# Patient Record
Sex: Female | Born: 1940 | Race: White | Hispanic: Yes | Marital: Married | Smoking: Former smoker
Health system: Southern US, Community
[De-identification: ages and names within clinical notes are randomized; demographics above are authoritative.]

## PROBLEM LIST (undated history)

## (undated) DIAGNOSIS — T884XXA Failed or difficult intubation, initial encounter: Secondary | ICD-10-CM

## (undated) DIAGNOSIS — T8859XA Other complications of anesthesia, initial encounter: Secondary | ICD-10-CM

## (undated) DIAGNOSIS — A150 Tuberculosis of lung: Secondary | ICD-10-CM

## (undated) DIAGNOSIS — Z9189 Other specified personal risk factors, not elsewhere classified: Secondary | ICD-10-CM

## (undated) DIAGNOSIS — T7840XA Allergy, unspecified, initial encounter: Secondary | ICD-10-CM

## (undated) DIAGNOSIS — I499 Cardiac arrhythmia, unspecified: Secondary | ICD-10-CM

## (undated) DIAGNOSIS — M1990 Unspecified osteoarthritis, unspecified site: Secondary | ICD-10-CM

## (undated) DIAGNOSIS — H409 Unspecified glaucoma: Secondary | ICD-10-CM

## (undated) DIAGNOSIS — E785 Hyperlipidemia, unspecified: Secondary | ICD-10-CM

## (undated) DIAGNOSIS — M8580 Other specified disorders of bone density and structure, unspecified site: Secondary | ICD-10-CM

## (undated) DIAGNOSIS — N6019 Diffuse cystic mastopathy of unspecified breast: Secondary | ICD-10-CM

## (undated) DIAGNOSIS — R011 Cardiac murmur, unspecified: Secondary | ICD-10-CM

## (undated) DIAGNOSIS — T4145XA Adverse effect of unspecified anesthetic, initial encounter: Secondary | ICD-10-CM

## (undated) DIAGNOSIS — K219 Gastro-esophageal reflux disease without esophagitis: Secondary | ICD-10-CM

## (undated) HISTORY — DX: Unspecified osteoarthritis, unspecified site: M19.90

## (undated) HISTORY — DX: Other specified disorders of bone density and structure, unspecified site: M85.80

## (undated) HISTORY — DX: Hyperlipidemia, unspecified: E78.5

## (undated) HISTORY — PX: TUBAL LIGATION: SHX77

## (undated) HISTORY — DX: Allergy, unspecified, initial encounter: T78.40XA

## (undated) HISTORY — PX: COLONOSCOPY: SHX174

## (undated) HISTORY — PX: BREAST EXCISIONAL BIOPSY: SUR124

## (undated) HISTORY — DX: Cardiac murmur, unspecified: R01.1

## (undated) HISTORY — DX: Diffuse cystic mastopathy of unspecified breast: N60.19

## (undated) HISTORY — DX: Tuberculosis of lung: A15.0

## (undated) HISTORY — PX: TONSILLECTOMY: SUR1361

---
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
## 1963-01-10 HISTORY — PX: NASAL SEPTUM SURGERY: SHX37

## 1967-01-10 DIAGNOSIS — A150 Tuberculosis of lung: Secondary | ICD-10-CM

## 1967-01-10 HISTORY — DX: Tuberculosis of lung: A15.0

## 1968-01-10 HISTORY — PX: APPENDECTOMY: SHX54

## 1970-01-09 HISTORY — PX: DILATION AND CURETTAGE OF UTERUS: SHX78

## 1976-01-10 HISTORY — PX: BREAST BIOPSY: SHX20

## 1998-06-15 ENCOUNTER — Ambulatory Visit (HOSPITAL_COMMUNITY): Admission: RE | Admit: 1998-06-15 | Discharge: 1998-06-15 | Payer: Self-pay | Admitting: *Deleted

## 1998-06-15 ENCOUNTER — Encounter: Payer: Self-pay | Admitting: *Deleted

## 1998-12-21 ENCOUNTER — Other Ambulatory Visit: Admission: RE | Admit: 1998-12-21 | Discharge: 1998-12-21 | Payer: Self-pay | Admitting: *Deleted

## 2000-01-06 ENCOUNTER — Other Ambulatory Visit: Admission: RE | Admit: 2000-01-06 | Discharge: 2000-01-06 | Payer: Self-pay | Admitting: *Deleted

## 2000-11-14 ENCOUNTER — Encounter (HOSPITAL_BASED_OUTPATIENT_CLINIC_OR_DEPARTMENT_OTHER): Payer: Self-pay | Admitting: General Surgery

## 2000-11-14 ENCOUNTER — Ambulatory Visit (HOSPITAL_COMMUNITY): Admission: RE | Admit: 2000-11-14 | Discharge: 2000-11-14 | Payer: Self-pay | Admitting: General Surgery

## 2000-11-20 ENCOUNTER — Encounter (HOSPITAL_BASED_OUTPATIENT_CLINIC_OR_DEPARTMENT_OTHER): Payer: Self-pay | Admitting: General Surgery

## 2000-11-22 ENCOUNTER — Ambulatory Visit (HOSPITAL_COMMUNITY): Admission: RE | Admit: 2000-11-22 | Discharge: 2000-11-23 | Payer: Self-pay | Admitting: General Surgery

## 2000-11-22 ENCOUNTER — Encounter (HOSPITAL_BASED_OUTPATIENT_CLINIC_OR_DEPARTMENT_OTHER): Payer: Self-pay | Admitting: General Surgery

## 2000-11-22 ENCOUNTER — Encounter (INDEPENDENT_AMBULATORY_CARE_PROVIDER_SITE_OTHER): Payer: Self-pay | Admitting: Specialist

## 2000-12-12 ENCOUNTER — Other Ambulatory Visit: Admission: RE | Admit: 2000-12-12 | Discharge: 2000-12-12 | Payer: Self-pay | Admitting: *Deleted

## 2002-01-09 HISTORY — PX: CHOLECYSTECTOMY: SHX55

## 2002-01-21 ENCOUNTER — Other Ambulatory Visit: Admission: RE | Admit: 2002-01-21 | Discharge: 2002-01-21 | Payer: Self-pay | Admitting: *Deleted

## 2002-02-26 ENCOUNTER — Encounter: Payer: Self-pay | Admitting: *Deleted

## 2002-02-26 ENCOUNTER — Ambulatory Visit (HOSPITAL_COMMUNITY): Admission: RE | Admit: 2002-02-26 | Discharge: 2002-02-26 | Payer: Self-pay | Admitting: *Deleted

## 2003-02-16 ENCOUNTER — Other Ambulatory Visit: Admission: RE | Admit: 2003-02-16 | Discharge: 2003-02-16 | Payer: Self-pay | Admitting: *Deleted

## 2003-03-04 ENCOUNTER — Ambulatory Visit (HOSPITAL_COMMUNITY): Admission: RE | Admit: 2003-03-04 | Discharge: 2003-03-04 | Payer: Self-pay | Admitting: *Deleted

## 2003-12-15 ENCOUNTER — Ambulatory Visit: Payer: Self-pay | Admitting: Internal Medicine

## 2003-12-30 ENCOUNTER — Encounter: Admission: RE | Admit: 2003-12-30 | Discharge: 2004-03-29 | Payer: Self-pay | Admitting: Internal Medicine

## 2004-02-22 ENCOUNTER — Other Ambulatory Visit: Admission: RE | Admit: 2004-02-22 | Discharge: 2004-02-22 | Payer: Self-pay | Admitting: *Deleted

## 2004-05-13 ENCOUNTER — Ambulatory Visit: Payer: Self-pay | Admitting: Internal Medicine

## 2004-05-18 ENCOUNTER — Ambulatory Visit: Payer: Self-pay | Admitting: Internal Medicine

## 2004-06-30 ENCOUNTER — Ambulatory Visit: Payer: Self-pay | Admitting: Internal Medicine

## 2005-01-09 ENCOUNTER — Encounter (INDEPENDENT_AMBULATORY_CARE_PROVIDER_SITE_OTHER): Payer: Self-pay | Admitting: *Deleted

## 2005-01-09 LAB — CONVERTED CEMR LAB

## 2005-01-13 ENCOUNTER — Other Ambulatory Visit: Admission: RE | Admit: 2005-01-13 | Discharge: 2005-01-13 | Payer: Self-pay | Admitting: Obstetrics and Gynecology

## 2005-01-16 ENCOUNTER — Ambulatory Visit: Payer: Self-pay | Admitting: Internal Medicine

## 2005-01-17 ENCOUNTER — Ambulatory Visit: Payer: Self-pay | Admitting: Internal Medicine

## 2005-03-01 ENCOUNTER — Ambulatory Visit: Payer: Self-pay | Admitting: Internal Medicine

## 2005-05-11 ENCOUNTER — Ambulatory Visit (HOSPITAL_COMMUNITY): Admission: RE | Admit: 2005-05-11 | Discharge: 2005-05-11 | Payer: Self-pay | Admitting: Obstetrics and Gynecology

## 2005-07-03 ENCOUNTER — Ambulatory Visit: Payer: Self-pay | Admitting: Internal Medicine

## 2005-09-01 ENCOUNTER — Ambulatory Visit: Payer: Self-pay | Admitting: Internal Medicine

## 2005-11-15 ENCOUNTER — Ambulatory Visit: Payer: Self-pay | Admitting: Internal Medicine

## 2005-11-15 LAB — CONVERTED CEMR LAB
HDL: 68.8 mg/dL (ref >39.0)
LDL Cholesterol: 77 mg/dL (ref 0–99)
Triglyceride fasting, serum: 49 mg/dL (ref 0–149)
VLDL: 10 mg/dL (ref 0–40)

## 2005-11-23 ENCOUNTER — Ambulatory Visit: Payer: Self-pay | Admitting: Internal Medicine

## 2005-11-23 LAB — CONVERTED CEMR LAB: Sed Rate: 19 mm/hr (ref 0–25)

## 2005-12-08 ENCOUNTER — Ambulatory Visit: Payer: Self-pay | Admitting: Family Medicine

## 2006-04-16 DIAGNOSIS — M8580 Other specified disorders of bone density and structure, unspecified site: Secondary | ICD-10-CM | POA: Insufficient documentation

## 2006-04-16 DIAGNOSIS — K589 Irritable bowel syndrome without diarrhea: Secondary | ICD-10-CM

## 2006-04-16 DIAGNOSIS — Z8611 Personal history of tuberculosis: Secondary | ICD-10-CM

## 2006-05-02 ENCOUNTER — Ambulatory Visit: Payer: Self-pay | Admitting: Internal Medicine

## 2006-05-02 LAB — CONVERTED CEMR LAB
ALT: 21 units/L (ref 0–40)
AST: 31 units/L (ref 0–37)
Albumin: 3.8 g/dL (ref 3.5–5.2)
Alkaline Phosphatase: 40 units/L (ref 39–117)
BUN: 17 mg/dL (ref 6–23)
Basophils Absolute: 0 10*3/uL (ref 0.0–0.1)
Calcium: 9 mg/dL (ref 8.4–10.5)
Chloride: 109 meq/L (ref 96–112)
Eosinophils Absolute: 0.1 10*3/uL (ref 0.0–0.6)
Eosinophils Relative: 2.8 % (ref 0.0–5.0)
GFR calc non Af Amer: 77 mL/min
Glucose, Bld: 89 mg/dL (ref 70–99)
MCV: 91.5 fL (ref 78.0–100.0)
Platelets: 219 10*3/uL (ref 150–400)
RBC: 4.46 M/uL (ref 3.87–5.11)
TSH: 1.08 microintl units/mL (ref 0.35–5.50)
Total CHOL/HDL Ratio: 2.2
Triglycerides: 47 mg/dL (ref 0–149)
WBC: 4.4 10*3/uL — ABNORMAL LOW (ref 4.5–10.5)

## 2006-05-21 ENCOUNTER — Ambulatory Visit: Payer: Self-pay | Admitting: Internal Medicine

## 2006-06-05 ENCOUNTER — Encounter: Payer: Self-pay | Admitting: Internal Medicine

## 2006-06-05 ENCOUNTER — Ambulatory Visit: Payer: Self-pay | Admitting: Internal Medicine

## 2006-06-20 ENCOUNTER — Ambulatory Visit: Payer: Self-pay | Admitting: Internal Medicine

## 2006-07-09 ENCOUNTER — Encounter (INDEPENDENT_AMBULATORY_CARE_PROVIDER_SITE_OTHER): Payer: Self-pay | Admitting: *Deleted

## 2006-07-24 ENCOUNTER — Telehealth: Payer: Self-pay | Admitting: Internal Medicine

## 2006-11-16 ENCOUNTER — Encounter: Payer: Self-pay | Admitting: Internal Medicine

## 2006-12-10 ENCOUNTER — Encounter (INDEPENDENT_AMBULATORY_CARE_PROVIDER_SITE_OTHER): Payer: Self-pay | Admitting: *Deleted

## 2006-12-12 ENCOUNTER — Telehealth (INDEPENDENT_AMBULATORY_CARE_PROVIDER_SITE_OTHER): Payer: Self-pay | Admitting: *Deleted

## 2006-12-28 ENCOUNTER — Telehealth (INDEPENDENT_AMBULATORY_CARE_PROVIDER_SITE_OTHER): Payer: Self-pay | Admitting: *Deleted

## 2007-02-05 ENCOUNTER — Encounter: Payer: Self-pay | Admitting: Internal Medicine

## 2007-06-04 ENCOUNTER — Telehealth (INDEPENDENT_AMBULATORY_CARE_PROVIDER_SITE_OTHER): Payer: Self-pay | Admitting: *Deleted

## 2007-07-01 ENCOUNTER — Encounter: Payer: Self-pay | Admitting: Internal Medicine

## 2007-09-02 ENCOUNTER — Ambulatory Visit: Payer: Self-pay | Admitting: Internal Medicine

## 2007-09-02 DIAGNOSIS — E785 Hyperlipidemia, unspecified: Secondary | ICD-10-CM | POA: Insufficient documentation

## 2007-09-02 LAB — CONVERTED CEMR LAB
HDL goal, serum: 50 mg/dL
LDL Goal: 100 mg/dL
Nitrite: NEGATIVE
Specific Gravity, Urine: >=1.03
Urobilinogen, UA: 0.2
WBC Urine, dipstick: NEGATIVE
pH: 5

## 2007-09-03 ENCOUNTER — Encounter (INDEPENDENT_AMBULATORY_CARE_PROVIDER_SITE_OTHER): Payer: Self-pay | Admitting: *Deleted

## 2007-09-03 LAB — CONVERTED CEMR LAB
ALT: 21 units/L (ref 0–35)
Basophils Absolute: 0 10*3/uL (ref 0.0–0.1)
Bilirubin, Direct: 0.1 mg/dL (ref 0.0–0.3)
CO2: 29 meq/L (ref 19–32)
Calcium: 9.6 mg/dL (ref 8.4–10.5)
Creatinine, Ser: 0.8 mg/dL (ref 0.4–1.2)
Eosinophils Absolute: 0.1 10*3/uL (ref 0.0–0.7)
GFR calc non Af Amer: 76 mL/min
Hemoglobin: 14.4 g/dL (ref 12.0–15.0)
Lymphocytes Relative: 26.8 % (ref 12.0–46.0)
MCHC: 33.8 g/dL (ref 30.0–36.0)
MCV: 93.7 fL (ref 78.0–100.0)
Neutro Abs: 2.6 10*3/uL (ref 1.4–7.7)
Neutrophils Relative %: 65.6 % (ref 43.0–77.0)
RDW: 12 % (ref 11.5–14.6)
Sodium: 143 meq/L (ref 135–145)
TSH: 1.26 microintl units/mL (ref 0.35–5.50)
Total Bilirubin: 1.4 mg/dL — ABNORMAL HIGH (ref 0.3–1.2)

## 2007-09-04 LAB — CONVERTED CEMR LAB: Vit D, 1,25-Dihydroxy: 34 (ref 30–89)

## 2007-09-10 ENCOUNTER — Ambulatory Visit: Payer: Self-pay | Admitting: Internal Medicine

## 2007-09-11 ENCOUNTER — Encounter (INDEPENDENT_AMBULATORY_CARE_PROVIDER_SITE_OTHER): Payer: Self-pay | Admitting: *Deleted

## 2007-10-29 ENCOUNTER — Encounter: Payer: Self-pay | Admitting: Internal Medicine

## 2007-12-06 ENCOUNTER — Telehealth (INDEPENDENT_AMBULATORY_CARE_PROVIDER_SITE_OTHER): Payer: Self-pay | Admitting: *Deleted

## 2007-12-11 ENCOUNTER — Telehealth: Payer: Self-pay | Admitting: Internal Medicine

## 2007-12-12 ENCOUNTER — Telehealth (INDEPENDENT_AMBULATORY_CARE_PROVIDER_SITE_OTHER): Payer: Self-pay | Admitting: *Deleted

## 2008-02-28 ENCOUNTER — Ambulatory Visit: Payer: Self-pay | Admitting: Internal Medicine

## 2008-03-15 LAB — CONVERTED CEMR LAB
ALT: 25 units/L (ref 0–35)
AST: 32 units/L (ref 0–37)
Albumin: 4 g/dL (ref 3.5–5.2)
Cholesterol: 178 mg/dL (ref 0–200)
HDL: 87.2 mg/dL (ref >39.0)
Hgb A1c MFr Bld: 5.5 % (ref 4.6–6.0)
Potassium: 3.8 meq/L (ref 3.5–5.1)
Total Bilirubin: 1.5 mg/dL — ABNORMAL HIGH (ref 0.3–1.2)
Total Protein: 6.6 g/dL (ref 6.0–8.3)
Triglycerides: 47 mg/dL (ref 0–149)
VLDL: 9 mg/dL (ref 0–40)
Vit D, 25-Hydroxy: 32 ng/mL (ref 30–89)

## 2008-03-16 ENCOUNTER — Encounter (INDEPENDENT_AMBULATORY_CARE_PROVIDER_SITE_OTHER): Payer: Self-pay | Admitting: *Deleted

## 2008-04-01 ENCOUNTER — Encounter (INDEPENDENT_AMBULATORY_CARE_PROVIDER_SITE_OTHER): Payer: Self-pay | Admitting: *Deleted

## 2008-04-16 ENCOUNTER — Ambulatory Visit: Payer: Self-pay | Admitting: Internal Medicine

## 2008-04-16 DIAGNOSIS — J45909 Unspecified asthma, uncomplicated: Secondary | ICD-10-CM | POA: Insufficient documentation

## 2008-04-16 DIAGNOSIS — E559 Vitamin D deficiency, unspecified: Secondary | ICD-10-CM

## 2008-04-16 LAB — CONVERTED CEMR LAB: Cholesterol, target level: 200 mg/dL

## 2008-07-27 ENCOUNTER — Ambulatory Visit: Payer: Self-pay | Admitting: Internal Medicine

## 2008-08-24 ENCOUNTER — Telehealth (INDEPENDENT_AMBULATORY_CARE_PROVIDER_SITE_OTHER): Payer: Self-pay | Admitting: *Deleted

## 2008-08-26 ENCOUNTER — Telehealth: Payer: Self-pay | Admitting: Internal Medicine

## 2008-08-27 ENCOUNTER — Ambulatory Visit: Payer: Self-pay | Admitting: Internal Medicine

## 2008-08-31 ENCOUNTER — Encounter (INDEPENDENT_AMBULATORY_CARE_PROVIDER_SITE_OTHER): Payer: Self-pay | Admitting: *Deleted

## 2008-09-11 ENCOUNTER — Ambulatory Visit: Payer: Self-pay | Admitting: Internal Medicine

## 2008-09-11 DIAGNOSIS — M1990 Unspecified osteoarthritis, unspecified site: Secondary | ICD-10-CM | POA: Insufficient documentation

## 2009-02-02 ENCOUNTER — Ambulatory Visit: Payer: Self-pay | Admitting: Internal Medicine

## 2009-02-02 ENCOUNTER — Telehealth: Payer: Self-pay | Admitting: Internal Medicine

## 2009-02-08 LAB — CONVERTED CEMR LAB: CRP: 0.1 mg/dL (ref <0.6)

## 2009-02-09 ENCOUNTER — Ambulatory Visit: Payer: Self-pay | Admitting: Internal Medicine

## 2009-04-12 ENCOUNTER — Ambulatory Visit: Payer: Self-pay | Admitting: Internal Medicine

## 2009-04-17 LAB — CONVERTED CEMR LAB
Alkaline Phosphatase: 42 units/L (ref 39–117)
BUN: 16 mg/dL (ref 6–23)
Basophils Relative: 0.8 % (ref 0.0–3.0)
Bilirubin, Direct: 0.1 mg/dL (ref 0.0–0.3)
Calcium: 9.7 mg/dL (ref 8.4–10.5)
Creatinine, Ser: 1 mg/dL (ref 0.4–1.2)
Eosinophils Absolute: 0.2 10*3/uL (ref 0.0–0.7)
GFR calc non Af Amer: 58.54 mL/min (ref >60)
MCHC: 34.4 g/dL (ref 30.0–36.0)
MCV: 93.8 fL (ref 78.0–100.0)
Monocytes Absolute: 0.4 10*3/uL (ref 0.1–1.0)
Neutro Abs: 3.3 10*3/uL (ref 1.4–7.7)
Neutrophils Relative %: 62.4 % (ref 43.0–77.0)
Potassium: 4.3 meq/L (ref 3.5–5.1)
RBC: 4.36 M/uL (ref 3.87–5.11)
Total Protein: 7 g/dL (ref 6.0–8.3)

## 2009-04-19 ENCOUNTER — Ambulatory Visit: Payer: Self-pay | Admitting: Internal Medicine

## 2010-02-02 ENCOUNTER — Ambulatory Visit (HOSPITAL_BASED_OUTPATIENT_CLINIC_OR_DEPARTMENT_OTHER)
Admission: RE | Admit: 2010-02-02 | Discharge: 2010-02-02 | Payer: Self-pay | Source: Home / Self Care | Attending: Internal Medicine | Admitting: Internal Medicine

## 2010-02-02 ENCOUNTER — Ambulatory Visit
Admission: RE | Admit: 2010-02-02 | Discharge: 2010-02-02 | Payer: Self-pay | Source: Home / Self Care | Attending: Internal Medicine | Admitting: Internal Medicine

## 2010-02-02 DIAGNOSIS — J069 Acute upper respiratory infection, unspecified: Secondary | ICD-10-CM | POA: Insufficient documentation

## 2010-02-03 ENCOUNTER — Telehealth (INDEPENDENT_AMBULATORY_CARE_PROVIDER_SITE_OTHER): Payer: Self-pay | Admitting: *Deleted

## 2010-02-08 NOTE — Assessment & Plan Note (Signed)
Summary: DISCUSS LABS FROM 1/6, 4 MONTH FOLLOWUP///SPH   Vital Signs:  Patient profile:   70 year old female Weight:      115 pounds BMI:     22.17 Pulse rate:   92 / minute Resp:     12 per minute BP sitting:   128 / 90  (right arm) Cuff size:   regular  Vitals Entered By: Shonna Chock (February 09, 2009 8:58 AM) CC: Follow-up visit: Discuss Labs (Copy given) Comments REVIEWED MED LIST, PATIENT AGREED DOSE AND INSTRUCTION CORRECT    CC:  Follow-up visit: Discuss Labs (Copy given).  History of Present Illness: See BP; @ home averages 124/78. Spironolactone is for edema of hands & "pre HTN". Strong FH of HTN. Labs reviewed : vitamin D level 51 on 50,000 International Units weekly  AND 2000 International Units for 3 months.BMD & NMR results pending  Allergies: 1)  ! * Hctz 2)  ! Iodine 3)  ! * Sulfates 4)  ! * Nitrites 5)  ! L-Glutamic Acid Monosod Salt (Monosodium Glutamate) 6)  ! * Perseratives in Food 7)  ! * Bee Stings  Past History:  Past Medical History: Displaced L  radial artery Hyperlipidemia: NMR 110( 1481/1136), TG 51,HDL 69. LDL goal = < 100, ideally < 75. 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 CV:  Denies chest pain or discomfort, leg cramps with exertion, palpitations, shortness of breath with exertion, swelling of feet, and swelling of hands. MS:  Complains of muscle aches; Calf cramping POST CVE. Neuro:  Denies headaches, numbness, and tingling. Psych:  Complains of anxiety; denies depression, easily angered, easily tearful, and irritability; having to care for grandchild during divorce.  Physical Exam  General:  Thin but well-nourished,alert,appropriate  and cooperative throughout examination Extremities:  No clubbing, cyanosis, edema, or deformity noted with normal full range of motion of all joints.  Minimal OA @ DIP joints & minimal crepitus of L shoulder . Crepitus of knees , L >R Neurologic:  alert & oriented X3.   Psych:  Intelligent & motivated   Impression & Recommendations:  Problem # 1:  VITAMIN D DEFICIENCY (ICD-268.9) corrected  Problem # 2:  HYPERLIPIDEMIA (ICD-272.4)  Her updated medication list for this problem includes:    Lipitor 40 Mg Tabs (Atorvastatin calcium) .Marland Kitchen... 1/2 tab mon,wed, fri  Problem # 3:  OSTEOPENIA (ICD-733.90) T score - 1.8 @ R femoral neck Her updated medication list for this problem includes:    Evista 60 Mg Tabs (Raloxifene hcl) .Marland Kitchen... 1 by mouth once daily  Complete Medication List: 1)  Lipitor 40 Mg Tabs (Atorvastatin calcium) .... 1/2 tab mon,wed, fri 2)  Multivitamins Tabs (Multiple vitamin) .... Take 1 tablet by mouth daily 3)  Calcium 1500/ Magnesium 500mg   .... 1 by mouth once daily 4)  Spironolactone 25 Mg Tabs (Spironolactone) .Marland Kitchen.. 1 once daily 5)  Omega 3  .... As directed 6)  Vitamin E 200 Unit Caps (Vitamin e) .... Take 1 capsule by mouth daily 7)  Vitamin C-rose Hips 1000 Mg Tabs (Ascorbic acid) .Marland Kitchen.. 1 by mouth two times a day 8)  Vitamin B Complex  .... As directed 9)  Coq10 100 Mg Caps (  Coenzyme q10) .Marland Kitchen.. 1 by mouth once daily 10)  Lumigan 0.03 % Soln (Bimatoprost) .Marland Kitchen.. 1 drop in left eye qhs 11)  Vitamin D 2000 Unit Tabs (Cholecalciferol) .Marland Kitchen.. 1 by mouth once daily 12)  Qvar 80 Mcg/act Aers (Beclomethasone dipropionate) .Marland Kitchen.. 1-2 puffs q 12 hrs as needed ; gargle & swallow after use 13)  Ventolin Hfa 108 (90 Base) Mcg/act Aers (Albuterol sulfate) .Marland Kitchen.. 1-2 puffs q 4  hrs as needed 14)  Vitamin D3 50,000 Unit Caps (cholecalciferol)  .... One pill weekly 15)  Evista 60 Mg Tabs (Raloxifene hcl) .Marland Kitchen.. 1 by mouth once daily 16)  Resveratrol 100 Mg Caps (Resveratrol) .Marland Kitchen.. 1 by mouth  once daily  Patient Instructions: 1)   Fasting labs 1 week pre appt  in 05/06/09(Codes V70.0, 272.4, 995.20).BMP prior to visit, ICD-9: 2)  Hepatic Panel prior to visit, ICD-9: 3)   NMR LipoprofileLipid Panel prior to visit, ICD-9: 4)  TSH prior to visit, ICD-9: 5)  CBC w/ Diff prior to visit, ICD-9:Ca++ 600 mg two times a day , vitamin 2000 International Units once daily & walking 3X/week for > 30 min.

## 2010-02-08 NOTE — Assessment & Plan Note (Signed)
Summary: cpx- jr   Vital Signs:  Patient profile:   70 year old female Height:      60.25 inches Weight:      116 pounds BMI:     22.55 Temp:     98.7 degrees F oral Pulse rate:   67 / minute Resp:     14 per minute BP sitting:   102 / 70  (left arm) Cuff size:   regular  Vitals Entered By: Shonna Chock (April 19, 2009 11:10 AM)  Comments REVIEWED MED LIST, PATIENT AGREED DOSE AND INSTRUCTION CORRECT    History of Present Illness: [name] [name] is here for a  [employer] physical; she has some arthritic symptoms which impact her walking. Preventive health care measures reviewed & up to date except no flu shot due to PMH of reactions with "flu symptoms".  Allergies: 1)  ! * Hctz 2)  ! Iodine 3)  ! * Sulfates 4)  ! * Nitrites 5)  ! L-Glutamic Acid Monosod Salt (Monosodium Glutamate) 6)  ! * Perseratives in Food 7)  ! * Bee Stings  Past History:  Past Medical History: Displaced L  radial artery Hyperlipidemia: NMR 04/12/2009: LDL 91(1114/91),HDL 103,TG 37. LDL goal = < 110, ideally < 80. 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 Breast biopsy  x 2 (bilateal fibrocystic breat disease) Cholecystectomy Tubal ligation (laparoscopic) Tonsillectomy  Family History: Father: Colon cancer @ 28, Parkinson's, prostate cancer  @ 73 (deceased) Mother: HTN, LBBB, cholecystectomy,osteopenia,  ?angina, ERD, MI  @ 44,PVD(carotids),d @ 39 Siblings:  sister: cholecyctectomy, thyroid disease, HTN ; sister : thyroid disease,fungal lung disease (?), osteopenia,lipids ,HTN;  brother: arthritis, gout, lipids,  HTN ; Paternal aunt:  DM MGF:  AAA, HTN, MI @ 56; P uncle MI  in 75s PGM:  DM, esophageal cancer; MGM CAD,HTN,osteopenia  Social History: limited calories & high fiber diet Occupation: Retired Scientist, clinical (histocompatibility and immunogenetics)  Exercise: walk 2.5 miles a day/5days a week and Wi- fit the other 2 days  Former Smoker: quit 1984 Alcohol use-yes: socially  Review of Systems  The patient denies anorexia, fever, weight loss, weight gain, vision loss, decreased hearing, hoarseness, chest pain, syncope, dyspnea on exertion, peripheral edema, headaches, abdominal pain, melena, hematochezia, severe indigestion/heartburn, hematuria, incontinence, suspicious skin lesions, depression, unusual weight change, abnormal bleeding, and enlarged lymph nodes.   Resp:  Denies chest pain with inspiration, coughing up blood, shortness of breath, sputum productive, and wheezing. MS:  Complains of joint pain; denies joint redness, joint swelling, mid back pain, and thoracic pain; L hip, LBP, toes hurt; no Rx. Neuro:  Denies brief paralysis, numbness, tingling, and weakness.  Physical Exam  General:  well-nourished; alert,appropriate and cooperative throughout examination Head:  Normocephalic and atraumatic without obvious abnormalities.  Eyes:  No corneal or conjunctival inflammation noted.Perrla. Funduscopic exam benign, without hemorrhages, exudates or papilledema.  Ears:  External ear exam shows no significant lesions or deformities.  Otoscopic examination reveals clear canals, tympanic membranes are intact bilaterally without bulging, retraction, inflammation or discharge. Hearing is grossly normal bilaterally. Nose:  External nasal examination shows no deformity or inflammation. Nasal mucosa  are pink and moist without lesions or exudates. Mouth:  Oral mucosa and oropharynx without lesions or exudates.  Teeth in good repair. Neck:  No deformities, masses, or tenderness noted. Lungs:  Normal respiratory effort, chest expands symmetrically.  Lungs are clear to auscultation, no crackles or wheezes. Heart:  normal rate, regular rhythm, no gallop, no rub, no JVD, no HJR, and grade 1/2-1  /6 systolic murmur LSB when supine.   Abdomen:  Bowel sounds positive,abdomen soft and non-tender without masses, organomegaly or hernias noted. Aortic bruit w/o AAA Genitalia:  Dr Edward Jolly Msk:  No deformity or scoliosis noted of thoracic or lumbar spine.   Pulses:  R and L carotid,radial,dorsalis pedis and posterior tibial pulses are full and equal bilaterally. L radial artery @ medial wrist Extremities:  No clubbing, cyanosis, edema, or deformity noted with normal full range of motion of all joints.  ganglion L wrist  Neurologic:  alert & oriented X3, strength normal in all extremities, heel/ toe gait normal, and DTRs symmetrical and normal.   Skin:  Intact without suspicious lesions or rashes Cervical Nodes:  No lymphadenopathy noted Axillary Nodes:  No palpable lymphadenopathy Psych:  Oriented X3, memory intact for recent and remote, and normally interactive.     Impression & Recommendations:  Problem # 1:  ROUTINE GENERAL MEDICAL EXAM@HEALTH  CARE FACL (ICD-V70.0)  Orders: EKG w/ Interpretation (93000)  Problem # 2:  HYPERLIPIDEMIA (ICD-272.4)  LDL @ goal  Her updated medication list for this problem includes:    Lipitor 40 Mg Tabs (Atorvastatin calcium) .Marland Kitchen... 1/2 tab mon,wed, fri  Orders: EKG w/ Interpretation (93000)  Problem # 3:  OSTEOPENIA (ICD-733.90)  Her updated medication list for this problem includes:    Evista 60 Mg Tabs (Raloxifene hcl) .Marland Kitchen... 1 by mouth once daily  Problem # 4:  VITAMIN D DEFICIENCY (ICD-268.9) corrected; now on 2000 International Units once daily   Complete Medication List: 1)  Lipitor 40 Mg Tabs (Atorvastatin calcium) .... 1/2 tab mon,wed, fri 2)  Multivitamins Tabs (Multiple vitamin) .... Take 1 tablet by mouth daily 3)  Calcium 1500/ Magnesium 500mg   .... 1 by mouth once daily 4)  Spironolactone  25 Mg Tabs (Spironolactone) .Marland Kitchen.. 1 once daily 5)  Omega 3  .... As directed 6)  Vitamin E 200 Units-tocopherol/tocotrienols  .... Take 1 capsule by mouth daily 7)  Vitamin C-rose Hips 1000 Mg Tabs (Ascorbic acid) .Marland Kitchen.. 1 by mouth two times a day 8)  Vitamin B Complex  .... As directed 9)  Coq10 100 Mg Caps (Coenzyme q10) .Marland Kitchen.. 1 by mouth once daily 10)  Lumigan 0.03 % Soln (Bimatoprost) .Marland Kitchen.. 1 drop in both eyes 11)  Vitamin D 2000 Unit Tabs (Cholecalciferol) .Marland Kitchen.. 1 by mouth once daily 12)  Qvar 80 Mcg/act Aers (Beclomethasone dipropionate) .Marland Kitchen.. 1-2 puffs q 12 hrs as needed ; gargle & swallow after use 13)  Ventolin Hfa 108 (90 Base) Mcg/act Aers (Albuterol sulfate) .Marland Kitchen.. 1-2 puffs q 4  hrs as needed 14)  Evista 60 Mg Tabs (Raloxifene hcl) .Marland Kitchen.. 1 by mouth once daily 15)  Resveratrol 100 Mg Caps (Resveratrol) .Marland Kitchen.. 1 by mouth once daily 16)  Vit K3  .Marland Kitchen.. 1 by mouth once daily 17)  Garlic 500 Mg Tabs (Garlic) .Marland Kitchen.. 1 by mouth once daily  Patient Instructions: 1)  Please schedule a follow-up appointment in 1 year or  as needed . BMD every 2 years. Prescriptions: SPIRONOLACTONE 25 MG  TABS (SPIRONOLACTONE) 1 once daily  #90 x 3  Entered and Authorized by:   Marga Melnick MD   Signed by:   Marga Melnick MD on 04/19/2009   Method used:   Print then Give to Patient   RxID:   [id number] LIPITOR 40 MG  TABS (ATORVASTATIN CALCIUM) 1/2 tab mon,wed, fri  #90 x 0   Entered and Authorized by:   Marga Melnick MD   Signed by:   Marga Melnick MD on 04/19/2009   Method used:   Print then Give to Patient   RxID:   [id number]

## 2010-02-08 NOTE — Progress Notes (Signed)
Summary: PHONE-LABS  Phone Note Call from Patient Call back at [facility] Phone [phone]   Caller: Patient Summary of Call: PATIENT IS REQUESTING A C REACTIVE PROTEIN TEST DONE THIS MORNING WITH OTHER BLOODWORK. PATIENT HAS APPT THIS MORNING Initial call taken by: Barb Merino,  February 02, 2009 8:22 AM  Follow-up for Phone Call        dr hopper pls advise................Marland KitchenFelecia Deloach CMA  February 02, 2009 8:46 AM   Additional Follow-up for Phone Call Additional follow up Details #1::        OK; Code 272.4 Additional Follow-up by: Marga Melnick MD,  February 02, 2009 8:56 AM

## 2010-02-09 ENCOUNTER — Ambulatory Visit (INDEPENDENT_AMBULATORY_CARE_PROVIDER_SITE_OTHER): Payer: Medicare Other | Admitting: Internal Medicine

## 2010-02-09 ENCOUNTER — Encounter: Payer: Self-pay | Admitting: Internal Medicine

## 2010-02-09 DIAGNOSIS — R05 Cough: Secondary | ICD-10-CM

## 2010-02-09 DIAGNOSIS — J209 Acute bronchitis, unspecified: Secondary | ICD-10-CM

## 2010-02-10 NOTE — Assessment & Plan Note (Signed)
Summary: POSSIBLE URI/KB   Vital Signs:  Patient profile:   70 year old female Weight:      114 pounds BMI:     22.16 O2 Sat:      99 % on Room air Temp:     99.4 degrees F oral Pulse rate:   72 / minute Resp:     13 per minute BP sitting:   120 / 76  (left arm) Cuff size:   regular  Vitals Entered By: Shonna Chock CMA (February 02, 2010 2:31 PM)  O2 Flow:  Room air CC: URI: fever/cough x 7 days , URI symptoms   CC:  URI: fever/cough x 7 days  and URI symptoms.  History of Present Illness:    Onset as ST 01/26/2010; she now reports scant  purulent nasal discharge, residual  sore throat, and dry cough, but denies nasal congestion and earache ( but pressure on airplane , R >L).  Associated symptoms include fever of 100.5-103 degrees and expiratory  wheezing.  The patient denies dyspnea.  The patient denies persistent  headache.  Risk factors for Strep sinusitis include tender adenopathy.  The patient denies the following risk factors for Strep sinusitis: bilateral facial pain and tooth pain.  Rx: Actifed, Ventolin, QVAR, ASA.  Current Medications (verified): 1)  Lipitor 40 Mg  Tabs (Atorvastatin Calcium) .... 1/2 Tab Mon,wed, Fri 2)  Multivitamins   Tabs (Multiple Vitamin) .... Take 1 Tablet By Mouth Daily 3)  Calcium 1500/ Magnesium 500mg  .... 1 By Mouth Once Daily 4)  Spironolactone 25 Mg  Tabs (Spironolactone) .Marland Kitchen.. 1 Once Daily 5)  Omega 3 .... As Directed 6)  Coq10 100 Mg Caps (Coenzyme Q10) .Marland Kitchen.. 1 By Mouth Once Daily 7)  Lumigan 0.03 % Soln (Bimatoprost) .Marland Kitchen.. 1 Drop in Both Eyes 8)  Vitamin D 2000 Unit Tabs (Cholecalciferol) .Marland Kitchen.. 1 By Mouth Once Daily 9)  Qvar 80 Mcg/act Aers (Beclomethasone Dipropionate) .Marland Kitchen.. 1-2 Puffs Q 12 Hrs As Needed ; Gargle & Swallow After Use 10)  Ventolin Hfa 108 (90 Base) Mcg/act Aers (Albuterol Sulfate) .Marland Kitchen.. 1-2 Puffs Q 4  Hrs As Needed 11)  Evista 60 Mg Tabs (Raloxifene Hcl) .Marland Kitchen.. 1 By Mouth Once Daily 12)  Resveratrol 100 Mg Caps (Resveratrol) .Marland Kitchen..  1 By Mouth Once Daily 13)  Vit K3 .Marland Kitchen.. 1 By Mouth Once Daily  Allergies: 1)  ! * Hctz 2)  ! Iodine 3)  ! * Sulfates 4)  ! * Nitrites 5)  ! L-Glutamic Acid Monosod Salt (Monosodium Glutamate) 6)  ! * Perseratives in Food 7)  ! * Bee Stings  Physical Exam  General:  in no acute distress; alert,appropriate and cooperative throughout examination Ears:  External ear exam shows no significant lesions or deformities.  Otoscopic examination reveals  wax collections Nose:  External nasal examination shows no deformity or inflammation. Nasal mucosa are pink and moist without lesions or exudates. Mouth:  Oral mucosa and oropharynx without lesions or exudates.  Teeth in good repair. Slightly hoarse Lungs:  Normal respiratory effort, chest expands symmetrically. Lungs :coarse crackles LLL; no  wheezes. Paroxysmal brassy cough Heart:  Normal rate and regular rhythm. S1 and S2 normal without gallop, murmur, click, rub or other extra sounds. Extremities:  No clubbing, cyanosis, edema.   Skin:  Intact without suspicious lesions or rashes Cervical Nodes:  No lymphadenopathy noted Axillary Nodes:  No palpable lymphadenopathy Psych:  memory intact for recent and remote, normally interactive, and good eye contact.  Impression & Recommendations:  Problem # 1:  BRONCHITIS-ACUTE (ICD-466.0)  R/O LLL  PNA Her updated medication list for this problem includes:    Qvar 80 Mcg/act Aers (Beclomethasone dipropionate) .Marland Kitchen... 1-2 puffs q 12 hrs as needed ; gargle & swallow after use    Ventolin Hfa 108 (90 Base) Mcg/act Aers (Albuterol sulfate) .Marland Kitchen... 1-2 puffs q 4  hrs as needed    Clarithromycin 500 Mg Xr24h-tab (Clarithromycin) .Marland Kitchen... 2 once daily with a meal ; hold lipitor while on this    Hydromet 5-1.5 Mg/61ml Syrp (Hydrocodone-homatropine) .Marland Kitchen... 1 tsp every 6 hrs as needed  Orders: T-2 View CXR (71020TC) Prescription Created Electronically [phone])  Problem # 2:  URI (ICD-465.9)  Her updated  medication list for this problem includes:    Hydromet 5-1.5 Mg/51ml Syrp (Hydrocodone-homatropine) .Marland Kitchen... 1 tsp every 6 hrs as needed  Orders: Prescription Created Electronically [phone])  Complete Medication List: 1)  Lipitor 40 Mg Tabs (Atorvastatin calcium) .... 1/2 tab mon,wed, fri 2)  Multivitamins Tabs (Multiple vitamin) .... Take 1 tablet by mouth daily 3)  Calcium 1500/ Magnesium 500mg   .... 1 by mouth once daily 4)  Spironolactone 25 Mg Tabs (Spironolactone) .Marland Kitchen.. 1 once daily 5)  Omega 3  .... As directed 6)  Coq10 100 Mg Caps (Coenzyme q10) .Marland Kitchen.. 1 by mouth once daily 7)  Lumigan 0.03 % Soln (Bimatoprost) .Marland Kitchen.. 1 drop in both eyes 8)  Vitamin D 2000 Unit Tabs (Cholecalciferol) .Marland Kitchen.. 1 by mouth once daily 9)  Qvar 80 Mcg/act Aers (Beclomethasone dipropionate) .Marland Kitchen.. 1-2 puffs q 12 hrs as needed ; gargle & swallow after use 10)  Ventolin Hfa 108 (90 Base) Mcg/act Aers (Albuterol sulfate) .Marland Kitchen.. 1-2 puffs q 4  hrs as needed 11)  Evista 60 Mg Tabs (Raloxifene hcl) .Marland Kitchen.. 1 by mouth once daily 12)  Resveratrol 100 Mg Caps (Resveratrol) .Marland Kitchen.. 1 by mouth once daily 13)  Vit K3  .Marland Kitchen.. 1 by mouth once daily 14)  Clarithromycin 500 Mg Xr24h-tab (Clarithromycin) .... 2 once daily with a meal ; hold lipitor while on this 15)  Prednisone 20 Mg Tabs (Prednisone) .Marland Kitchen.. 1 two times a day with meals 16)  Hydromet 5-1.5 Mg/67ml Syrp (Hydrocodone-homatropine) .Marland Kitchen.. 1 tsp every 6 hrs as needed  Patient Instructions: 1)  Drink as much fluid as you can tolerate for the next few days. Prescriptions: HYDROMET 5-1.5 MG/5ML SYRP (HYDROCODONE-HOMATROPINE) 1 tsp every 6 hrs as needed  #120 cc x 0   Entered and Authorized by:   Marga Melnick MD   Signed by:   Marga Melnick MD on 02/02/2010   Method used:   Printed then faxed to ...       [facility] [phone]* (retail)       [address]       [place], [place]  [address]       Ph: [phone]       Fax: [phone]   RxID:    [id number] PREDNISONE 20 MG TABS (PREDNISONE) 1 two times a day with meals  #14 x 0   Entered and Authorized by:   Marga Melnick MD   Signed by:   Marga Melnick MD on 02/02/2010   Method used:   Electronically to        [facility] [phone]* (retail)       [address]       [place],  [place]  [address]       Ph: [phone]       Fax: [phone]   RxID:   [id number] CLARITHROMYCIN 500 MG XR24H-TAB (CLARITHROMYCIN) 2 once daily with a meal ; hold Lipitor while on this  #20 x 0   Entered and Authorized by:   Marga Melnick MD   Signed by:   Marga Melnick MD on 02/02/2010   Method used:   [facility] to        [facility] [phone]* (retail)       [address]       [place], [place]  [address]       Ph: [phone]       Fax: [phone]   RxID:   [id number]    Orders Added: 1)  Est. Patient Level III [66063] 2)  T-2 View CXR [71020TC] 3)  Prescription Created Electronically [phone]

## 2010-02-10 NOTE — Progress Notes (Signed)
Summary: Chest Xray Results  Phone Note Outgoing Call Call back at Home Phone [phone]   Call placed by: Shonna Chock CMA,  February 03, 2010 9:00 AM Call placed to: Patient Summary of Call: Left message on machine for patient to return call when avaliable, Reason for call:   No radiographic pneumonia , but the persistent rales in the LLL are worrisome for subclinical disease. Sometimes PNA wil not be apparent if there is any componenet of dehygration.Please finish entire course of meds. Levester Fresh CMA  February 03, 2010 9:01 AM   Follow-up for Phone Call        Spoke with patient, patient ok'd results and aware copy of report to be mailed  Follow-up by: Shonna Chock CMA,  February 03, 2010 9:23 AM

## 2010-02-16 NOTE — Miscellaneous (Signed)
Summary: Orders Update  Clinical Lists Changes  Orders: Added new Service order of Prescription Created Electronically ([id number]) - Signed 

## 2010-02-16 NOTE — Assessment & Plan Note (Signed)
Summary: DISCUSS NEW ABS FOR URI/KB   Vital Signs:  Patient profile:   70 year old female Weight:      114.6 pounds BMI:     22.28 Temp:     98.3 degrees F oral Pulse rate:   60 / minute Resp:     13 per minute BP sitting:   108 / 70  (left arm) Cuff size:   regular  Vitals Entered By: Shonna Chock CMA (February 09, 2010 11:38 AM) CC: URI: ongoing post nasal drip and chronic cough. Patient contacted ENT and first avaliable 03/2010, URI symptoms   CC:  URI: ongoing post nasal drip and chronic cough. Patient contacted ENT and first avaliable 03/2010 and URI symptoms.  History of Present Illness:    Major residual  issues are brassy cough, PNDrainage & head congestion; she has 2 days left of Clarithromycin. The patient now  reports dry cough, but denies purulent nasal discharge, sore throat (except with the cough syrup), and earache.  Associated symptoms include  expiratory wheezing.  The patient denies fever and dyspnea.  The patient denies  frontal headache,bilateral facial pain, tooth pain, and tender adenopathy.  Oral steroids & QVAR did not help.   Allergies: 1)  ! * Hctz 2)  ! Iodine 3)  ! * Sulfates 4)  ! * Nitrites 5)  ! L-Glutamic Acid Monosod Salt (Monosodium Glutamate) 6)  ! * Perseratives in Food 7)  ! * Bee Stings  Physical Exam  General:  well-nourished,in no acute distress; alert,appropriate and cooperative throughout examination Ears:  External ear exam shows no significant lesions or deformities.  Otoscopic examination reveals clear canals, tympanic membranes are intact bilaterally without bulging, retraction, inflammation or discharge. Some wax bilaterally Nose:  External nasal examination shows no deformity or inflammation. Nasal mucosa are pink and moist without lesions or exudates. Mouth:  Oral mucosa and oropharynx without lesions or exudates.  Teeth in good repair. Lungs:  Normal respiratory effort, chest expands symmetrically. Lungs are clear to  auscultation, no crackles or wheezes but brassy , paroxysmal cough Cervical Nodes:  minor L  posterior LA Axillary Nodes:  No palpable lymphadenopathy   Impression & Recommendations:  Problem # 1:  BRONCHITIS-ACUTE (ICD-466.0) resolving Her updated medication list for this problem includes:    Qvar 80 Mcg/act Aers (Beclomethasone dipropionate) .Marland Kitchen... 1-2 puffs q 12 hrs as needed ; gargle & swallow after use (Hold while on Dulera)    Ventolin Hfa 108 (90 Base) Mcg/act Aers (Albuterol sulfate) .Marland Kitchen... 1-2 puffs q 4  hrs as needed    Clarithromycin 500 Mg Xr24h-tab (Clarithromycin) .Marland Kitchen... 2 once daily with a meal ; hold lipitor while on this    Hydromet 5-1.5 Mg/63ml Syrp (Hydrocodone-homatropine) .Marland Kitchen... 1 tsp every 6 hrs as needed    Dulera 200-5 Mcg/act Aero (Mometasone furo-formoterol fum) .Marland Kitchen... 1-2 puffs every 12 hrs ; gargle & spit  after use    Singulair 10 Mg Tabs (Montelukast sodium) .Marland Kitchen... 1 once daily as needed  Problem # 2:  COUGH (ICD-786.2) probable RAD from # 1  Complete Medication List: 1)  Lipitor 40 Mg Tabs (Atorvastatin calcium) .... 1/2 tab mon,wed, fri 2)  Multivitamins Tabs (Multiple vitamin) .... Take 1 tablet by mouth daily 3)  Calcium 1500/ Magnesium 500mg   .... 1 by mouth once daily 4)  Spironolactone 25 Mg Tabs (Spironolactone) .Marland Kitchen.. 1 once daily 5)  Omega 3  .... As directed 6)  Coq10 100 Mg Caps (Coenzyme q10) .Marland Kitchen.. 1 by mouth  once daily 7)  Lumigan 0.03 % Soln (Bimatoprost) .Marland Kitchen.. 1 drop in both eyes 8)  Vitamin D 2000 Unit Tabs (Cholecalciferol) .Marland Kitchen.. 1 by mouth once daily 9)  Qvar 80 Mcg/act Aers (Beclomethasone dipropionate) .Marland Kitchen.. 1-2 puffs q 12 hrs as needed ; gargle & swallow after use 10)  Ventolin Hfa 108 (90 Base) Mcg/act Aers (Albuterol sulfate) .Marland Kitchen.. 1-2 puffs q 4  hrs as needed 11)  Evista 60 Mg Tabs (Raloxifene hcl) .Marland Kitchen.. 1 by mouth once daily 12)  Resveratrol 100 Mg Caps (Resveratrol) .Marland Kitchen.. 1 by mouth once daily 13)  Vit K3  .Marland Kitchen.. 1 by mouth once daily 14)   Clarithromycin 500 Mg Xr24h-tab (Clarithromycin) .... 2 once daily with a meal ; hold lipitor while on this 15)  Hydromet 5-1.5 Mg/23ml Syrp (Hydrocodone-homatropine) .Marland Kitchen.. 1 tsp every 6 hrs as needed 16)  Fluticasone Propionate 50 Mcg/act Susp (Fluticasone propionate) .Marland Kitchen.. 1 spray two times a day as needed 17)  Dulera 200-5 Mcg/act Aero (Mometasone furo-formoterol fum) .Marland Kitchen.. 1-2 puffs every 12 hrs ; gargle & spit  after use 18)  Singulair 10 Mg Tabs (Montelukast sodium) .Marland Kitchen.. 1 once daily as needed  Patient Instructions: 1)  Neti pot once daily- two times a day as needed  followed by nasal spray.Drink as much NON dairy  fluid as you can tolerate for the next few days. Chew sugarless gum & do Valsalva in flight as needed  Prescriptions: SINGULAIR 10 MG TABS (MONTELUKAST SODIUM) 1 once daily as needed  #30 x 5   Entered and Authorized by:   Marga Melnick MD   Signed by:   Marga Melnick MD on 02/09/2010   Method used:   Print then Give to Patient   RxID:   [id number] DULERA 200-5 MCG/ACT AERO (MOMETASONE FURO-FORMOTEROL FUM) 1-2 puffs every 12 hrs ; gargle & spit  after use  #1 x 5   Entered and Authorized by:   Marga Melnick MD   Signed by:   Marga Melnick MD on 02/09/2010   Method used:   Print then Give to Patient   RxID:   [id number] FLUTICASONE PROPIONATE 50 MCG/ACT SUSP (FLUTICASONE PROPIONATE) 1 spray two times a day as needed  #1 x 5   Entered and Authorized by:   Marga Melnick MD   Signed by:   Marga Melnick MD on 02/09/2010   Method used:   Electronically to        [facility] [phone]* (retail)       [address]       [place], [place]  [address]       Ph: [phone]       Fax: [phone]   RxID:   [id number]    Orders Added: 1)  Est. Patient Level III [01093]

## 2010-03-18 ENCOUNTER — Telehealth (INDEPENDENT_AMBULATORY_CARE_PROVIDER_SITE_OTHER): Payer: Self-pay | Admitting: *Deleted

## 2010-03-22 NOTE — Progress Notes (Signed)
Summary: questions about labs  Phone Note Call from Patient Call back at Home Phone [phone]   Caller: Patient Summary of Call: patient is now scheduled for labs on 4/11 and cpx on 4/18---she says she and Dr Alwyn Ren discussed her lab work at earlier  visit  (she is in the healthcare business too)  and she called and made appt for labs with Lowella Bandy this morning and it was set up for one week before her physical.   She called back to add more labs to her list and I pointed out to her that Dr Alwyn Ren wants his Medicare patients to have labs on the day of the physical or after---(she has Medicare)  1)  she wanted me to check to see if Dr Alwyn Ren said it is OK for her to come in one week early for her labs 2)  please review Huge list of lab work for the 4/11 lab appt---this was her list that she gave to [facility] , then she called back and added more and I cannot find a list of future labs with diag codes  needs call back at [phone] Initial call taken by: Jerolyn Shin,  March 18, 2010 9:49 AM  Follow-up for Phone Call        left message on machine .......Marland KitchenDoristine Devoid CMA  March 18, 2010 10:51 AM   spoke w/ patient explain information and says that she will just wait to have labs when she comes in for appt.......Marland KitchenDoristine Devoid CMA  March 18, 2010 11:43 AM

## 2010-04-20 ENCOUNTER — Other Ambulatory Visit: Payer: PRIVATE HEALTH INSURANCE

## 2010-04-27 ENCOUNTER — Ambulatory Visit (INDEPENDENT_AMBULATORY_CARE_PROVIDER_SITE_OTHER): Payer: Medicare Other | Admitting: Internal Medicine

## 2010-04-27 ENCOUNTER — Encounter: Payer: Self-pay | Admitting: Internal Medicine

## 2010-04-27 DIAGNOSIS — E559 Vitamin D deficiency, unspecified: Secondary | ICD-10-CM

## 2010-04-27 DIAGNOSIS — Z0000 Encounter for general adult medical examination without abnormal findings: Secondary | ICD-10-CM

## 2010-04-27 DIAGNOSIS — M949 Disorder of cartilage, unspecified: Secondary | ICD-10-CM

## 2010-04-27 DIAGNOSIS — Z8611 Personal history of tuberculosis: Secondary | ICD-10-CM

## 2010-04-27 DIAGNOSIS — E785 Hyperlipidemia, unspecified: Secondary | ICD-10-CM

## 2010-04-27 NOTE — Progress Notes (Signed)
Subjective:    Patient ID: [name] [name], female    DOB: 1940/10/24, 70 y.o.   MRN: [id number]  HPI Medicare Wellness Visit:  The following psychosocial & medical history were reviewed as required by Medicare.   Social history: caffeine:1  diet coke daily , alcohol:< 1 /day ,  tobacco use: quit 1980  & exercise: walking & yoga 6X/ week for > 60 min.   Home & personal  Safety: no issues , activities of daily living: no limitations , seatbelt use: yes , and smoke alarm employment: yes .  Power of AttorneyLliving Will status  In place  Hearing and vision evaluation  Whisper heard @ 6 ft. Orientation :oriented X3 , memory & recall:  Normal , spelling  excellent ,and mood & affect   normal  Travel history  : 2007 [place] , immunization status : Up to date , transfusion history :no, and preventive health surveillance :up to date, Dental care : every 3 months . Chart reviewed &  Updated. Active issues reviewed & addressed.   Her major active issue is arthralgia involving the right lower back and the left hip. She is taking a supplement with good response.        Review of Systems Patient reports no vision/ hearing  changes, adenopathy,fever, weight change,  persistant / recurrent hoarseness , swallowing issues, chest pain,palpitations,edema,persistant /recurrent cough, hemoptysis, dyspnea( rest/ exertional/paroxysmal nocturnal), gastrointestinal bleeding(melena, rectal bleeding), abdominal pain, significant heartburn, bowel changes,GU symptoms(dysuria, hematuria,pyuria, incontinence) ), Gyn symptoms(abnormal  bleeding , pain),  syncope, focal weakness, memory loss,numbness & tingling, skin/hair /nail changes,abnormal bruising or bleeding, anxiety,or depression. She stays cold; no Raynaud's     Objective:   Physical Exam Gen.: Thin but healthy and well-nourished in appearance. Alert, appropriate and cooperative throughout exam. Head: Normocephalic without obvious abnormalities;  Hair fine.Eyes: No  corneal or conjunctival inflammation noted. Pupils equal round reactive to light and accommodation. Fundal exam is benign without hemorrhages, exudate, papilledema. Extraocular motion intact. Vision grossly normal. Ears: External  ear exam reveals no significant lesions or deformities. Canals clear .TMs normal. Hearing is grossly normal bilaterally. Nose: External nasal exam reveals no deformity or inflammation. Nasal mucosa are pink and moist. No lesions or exudates noted. Septum  normal  Mouth: Oral mucosa and oropharynx reveal no lesions or exudates. Teeth in good repair. Neck: No deformities, masses, or tenderness noted. Range of motion normal. Thyroid: no nodules or enlargement. Lungs: Normal respiratory effort; chest expands symmetrically. Lungs are clear to auscultation without rales, wheezes, or increased work of breathing. Heart: Normal rate and rhythm. Normal S1 and S2. No gallop, click, or rub. Grade 1/2 systolic  murmur. Abdomen: Bowel sounds normal; abdomen soft and nontender. No masses, organomegaly or hernias noted. Genitalia: Dr Edward Jolly                                                                                     Musculoskeletal/extremities: No deformity or scoliosis noted of  the thoracic or lumbar spine. No clubbing, cyanosis, edema, or deformity noted. Range of motion  supranormal .Tone & strength  normal.Joints normal. Nail health  good. Vascular: Carotid, radial artery, dorsalis pedis and dorsalis posterior  tibial pulses are full and equal. Aorta palpable. No bruits present. Neurologic: Alert and oriented x3. Deep tendon reflexes symmetrical and normal.         Skin: Intact without suspicious lesions or rashes. Lymph: No cervical, axillary, or inguinal lymphadenopathy present. Psych: Mood and affect are normal. Normally interactive                                                                                         Assessment & Plan:  #1 Medical  Wellness exam;  criteria met and data  entered  #2 arthralgias, left hip right lower back. This has been responsive to supplements.  #3 dyslipidemia. She expresses an interest in coming off her statin. Family history is strongly positive for premature coronary artery disease/myocardial infarction  Plan: #1 an advanced cholesterol panel ([place] heart panel) will be scheduled. This along with the prior MMR lipoprotein  will provide optimal risk assessment

## 2010-04-27 NOTE — Patient Instructions (Addendum)
Please stop the Lipitor. After 12 weeks have an advanced cholesterol panel (Boston heart panel, 1304X) performed with a Vitamin D level. This will optimally assess your long-term risk and indications ,if  any, for taking a statin. After you receive the results of that test please bring the booklet and  your prior NMR lipoprotein to a follow up appt  to allow a detailed evaluation and assessment. (Codes: 272.4, V17.3, 268.9) Preventive Health Care: Exercise  30-45  minutes a day, 3-4 days a week. Walking is especially valuable in preventing Osteoporosis. Eat a low-fat diet with lots of fruits and vegetables, up to 7-9 servings per day. Avoid obesity; your goal = waist less than 35 inches.Consume less than 30 grams of sugar per day from foods & drinks with High Fructose Corn Syrup as #2,3 or #4 on label.

## 2010-04-27 NOTE — Assessment & Plan Note (Signed)
RUL 1969; treated with isolation & 3 drugs

## 2010-04-27 NOTE — Assessment & Plan Note (Signed)
NMR Lipoprofile completed

## 2010-05-23 ENCOUNTER — Other Ambulatory Visit: Payer: Self-pay | Admitting: *Deleted

## 2010-05-23 MED ORDER — SPIRONOLACTONE 25 MG PO TABS: 25.0000 mg | ORAL_TABLET | Freq: Every day | ORAL | Status: DC

## 2010-05-24 NOTE — Letter (Signed)
May 07, 2006    Titus Dubin. Alwyn Ren, MD,FACP,FCCP  [phone] [place]  [place] [address]   RE:  [name], [name]  MRN:  [id number]  /  DOB:  04/09/1940   Dear Hop,   Thank you for sending me a progress note on [name] [name].  [name] [name]  was asking me about followup colonoscopy.  The last one was done in  November of 2000 and she has a positive family history of colon cancer  in her father.  I have reviewed the chart and I agree with you that  there has been a mistake made in her recall program.  She are to have a  colonoscopy every five years.  Since the last one was in November of  2000, she ought to have won this year.  With your permission, we will  notify [name] [name] and schedule her for a colonoscopy, and her earliest  convenience.   Thank you very much for bringing this to my attention.    Sincerely,      Hedwig Morton. Juanda Chance, MD  Electronically Signed    DMB/MedQ  DD: 05/07/2006  DT: 05/08/2006  Job #: [id number]

## 2010-05-27 NOTE — Op Note (Signed)
[facility]  Patient:    [name] [name], [name] [name] Visit Number: [id number] MRN: [id number]          Service Type: DSU Location: [facility] 28[address] 21 Attending Physician:  Sonda Primes Dictated by:   Mardene Celeste Lurene Shadow, M.D. Proc. Date: 11/22/00 Admit Date:  11/22/2000                             Operative Report  PREOPERATIVE DIAGNOSIS:  Chronic calculous cholecystitis.  POSTOPERATIVE DIAGNOSIS:  Chronic calculous cholecystitis.  PROCEDURE:  Laparoscopic cholecystectomy with intraoperative cholangiogram.  SURGEON:  Luisa Hart L. Lurene Shadow, M.D.  ASSISTANT:  Marnee Spring. Wiliam Ke, M.D.  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  She was evaluated by ultrasound and noted to have cholelithiasis and some thickening of her gallbladder wall. The common duct appeared to be of normal caliber at 3 mm.  Liver function studies were done.  Alkaline phosphatase, SGOT, SGPT, and bilirubin were all within normal limits.  The amylase and lipase levels were also within normal limits.  She is brought now to the operating room for a laparoscopic cholecystectomy after the risks and potential benefits of surgery have been fully discussed and she gives consent.  DESCRIPTION OF PROCEDURE:  Following the induction of satisfactory general anesthesia with the patient positioned supinely, the abdomen is routinely prepped and draped to be included in the sterile operative field.  Open laparoscopy is created at the umbilicus through a previous supraumbilical scar at an abdominal site, and the abdomen was entered and the Hasson cannula inserted at 14 mmHg using CO2 was inserted into the abdomen.  The camera was inserted, visual exploration of the abdomen carried out.  The gallbladder was noted to be chronically scarred.  There were  multiple adhesions from the duodenum and around the abdomen.  The liver edges were sharp, and the surface was smooth.  The anterior gastric wall and duodenal sweep appeared to be normal.  None of the small or large intestine viewed appeared to be abnormal. There were some adhesions in the right lower quadrant from a previous appendectomy site.  The pelvic organs were not visualized.  Under direct vision epigastric and lateral ports were placed.  The gallbladder was grasped and retracted cephalad and adhesions dissected free from the gallbladder wall and around the ampulla.  Dissection in the region of the ampulla isolated the cystic artery and the cystic duct.  The cystic duct was somewhat larger than usual, and this was dissected free up to the cystic duct and gallbladder junction.  The cystic artery was then also dissected free, doubly clipped, and transected.  The cystic duct was clipped proximally and opened.  A cholangiogram was carried out using a Reddick catheter, which was passed into the abdomen through a 14-gauge Angiocath.  Approximately 3 cc of Renografin was injected into the biliary system.  There was free flow of contrast into the common bile duct and down into the duodenum.  There was normal tapering of the distal common bile duct.  The upper radicles were of normal caliber and appeared without any filling defects.  The cholangiocatheter was then removed and the cystic duct then triply clipped and transected.  The gallbladder then dissected free from the liver bed using electrocautery, maintaining hemostasis throughout the course of this dissection.  At  the end of the dissection the liver bed was again thoroughly inspected.  Additional bleeding points were treated with electrocautery.  The right upper quadrant was thoroughly irrigated with normal saline.  The camera then removed through the epigastric port and the gallbladder retrieved through the umbilical port without  difficulty.  The pneumoperitoneum was then allowed to deflate completely.  Sponge, instrument, and sharp counts were then verified and the wound was then closed in layers as follows:  The umbilical wound closed in two layers with 0 Dexon and 4-0 Dexon, the epigastric and lateral wounds were then closed with 4-0 Dexon sutures.  All wounds were reinforced with Steri-Strips and sterile dressings applied.  Anesthetic reversed, patient removed from the operating room to the recovery room in stable condition, having tolerated the procedure well. Dictated by:   Mardene Celeste. Lurene Shadow, M.D. Attending Physician:  Sonda Primes DD:  11/22/00 TD:  11/22/00 Job: [id number]

## 2010-05-27 NOTE — Assessment & Plan Note (Signed)
[facility]                        [place] OFFICE NOTE   NAME:[name], [name] WILLINGHAM                      MRN:          [id number]  DATE:05/02/2006                            DOB:          1940-07-22    [name], date of birth 1940/12/31, unit number [id number],  was seen for a comprehensive physical examination on May 02, 2006.   Active problems include arthralgias, particularly in the left hip and  knee, for which she is taking 1500 mg of glucosamine and 1200 mg of  chondroitin.  She does have osteopenia; her T score was -2.2 at the  femoral neck in May of 2006.  She has been on 1200 mg of calcium, but is  concerned about recent reports suggesting increased cardiovascular risks  with calcium intake over 1000 mg a day.  She feels that she is getting  much more than a 1000 international units of vitamin D daily.   She will walk 4 miles per day and also employs weights; she has no  significant musculoskeletal symptoms or cardiopulmonary symptoms with  this.   PAST MEDICAL HISTORY:  Negative colonoscopy in  2003.  She is concerned  that she is not due for follow up until 2013 by Dr. Juanda Chance.  Her father  had colon cancer and maternal grandmother had esophageal cancer. She  will contact Dr Regino Schultze office concerning colonoscopy frequency  indications based on family history.   She has also had an appendectomy, breast biopsy which revealed  fibrocystic disease, one pregnancy and one delivery, laparoscopic tubal  and also a cholecystectomy.  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   Maternal grandfather had aortic aneurysm and hypertension.  Mother has  hypertension, left bundle branch block, questionable angina,  esophageal  reflux, and myocardial infarction.  Maternal grandmother had diabetes as  well as the esophageal cancer.  Maternal aunt had diabetes.  Her father  also had Parkinson's and prostate cancer.  Two sisters have had  cholecystectomy, as has her mother.  Her mother, sister and brother have  arthritis; brother has been diagnosed with gout.   She quit smoking in 1984.  She drinks socially.   She has no known drug allergies but Hydrochlorothiazide caused  hypokalemia.   She is on Vivelle Dot &  Prometrium 100 mg  from Dr Wyvonnia Lora;  Fosamax with vitamin D; Spironolactone 25 mg every other day;  multivitamin supplements; Lipitor 40 mg 1/2 three days a week and over-  the-counter ranitidine or Pepcid if needed.   The remainder of the review of systems was completed in toto and is  negative.   She is 5 foot 1/2 inch and weight is  stable @117  fully clothed. Pulse  is 72 and regular, respiratory rate 12 and blood pressure 110/72.  Fundal exam reveals essentially normal vasculature.  She states that she  has been diagnosed with cataracts,  but these were nor visible with the  ophthalmoscope.  Dental hygiene is exquisite.  Otolaryngologic exam is  unremarkable.  Thyroid is normal to palpation.  I cannot appreciate murmur at this time.  Chest was clear.  She does have a bruit over the aorta, but there is no aortic aneurysm.  She has no organomegaly or lymphadenopathy.  She has mild crepitus in the left knee; she has negative straight leg  raising.  The remainder of the musculoskeletal exam is unremarkable.  Neuropsychiatric evaluation is negative as well.   EKG is normal.   Because of her osteopenia, a vitamin D level will be checked.  To  evaluate the arthritic symptoms, RA, sed rate and uric acid will be  drawn in addition to the routine labs.   A copy of this will be send to Dr. Lina Sar to allow clarification of  colonoscopy  followup because of family history.  Stool  cards will be  provided.  It is significant to note that she is on glucosamine, which  might raise cholesterol; she remains on the Lipitor.BMD is due after May  ,2008.     Titus Dubin. Alwyn Ren, MD,FACP,FCCP  Electronically Signed    [facility]/MedQ  DD: 05/02/2006  DT: 05/02/2006  Job #: [id number]   cc:   Hedwig Morton. Juanda Chance, MD

## 2010-06-28 ENCOUNTER — Telehealth: Payer: Self-pay

## 2010-06-28 DIAGNOSIS — Z780 Asymptomatic menopausal state: Secondary | ICD-10-CM

## 2010-06-28 DIAGNOSIS — M8580 Other specified disorders of bone density and structure, unspecified site: Secondary | ICD-10-CM

## 2010-06-28 NOTE — Telephone Encounter (Signed)
Spoke w/ pt due for bone density in July just needed order to have done at [facility].

## 2010-07-19 ENCOUNTER — Other Ambulatory Visit: Payer: PRIVATE HEALTH INSURANCE

## 2010-07-21 ENCOUNTER — Other Ambulatory Visit: Payer: Self-pay | Admitting: Internal Medicine

## 2010-07-21 DIAGNOSIS — E559 Vitamin D deficiency, unspecified: Secondary | ICD-10-CM

## 2010-07-22 ENCOUNTER — Other Ambulatory Visit (INDEPENDENT_AMBULATORY_CARE_PROVIDER_SITE_OTHER): Payer: Medicare Other

## 2010-07-22 DIAGNOSIS — E559 Vitamin D deficiency, unspecified: Secondary | ICD-10-CM

## 2010-07-22 NOTE — Progress Notes (Signed)
Labs only

## 2010-08-05 ENCOUNTER — Ambulatory Visit
Admission: RE | Admit: 2010-08-05 | Discharge: 2010-08-05 | Disposition: A | Discharge status: Home / Self Care | Payer: PRIVATE HEALTH INSURANCE | Source: Ambulatory Visit | Attending: Internal Medicine | Admitting: Internal Medicine

## 2010-08-05 ENCOUNTER — Ambulatory Visit (INDEPENDENT_AMBULATORY_CARE_PROVIDER_SITE_OTHER)
Admission: RE | Admit: 2010-08-05 | Discharge: 2010-08-05 | Disposition: A | Discharge status: Home / Self Care | Payer: Medicare Other | Source: Ambulatory Visit

## 2010-08-05 DIAGNOSIS — M8580 Other specified disorders of bone density and structure, unspecified site: Secondary | ICD-10-CM

## 2010-08-05 DIAGNOSIS — M899 Disorder of bone, unspecified: Secondary | ICD-10-CM

## 2010-08-05 DIAGNOSIS — Z780 Asymptomatic menopausal state: Secondary | ICD-10-CM

## 2010-08-05 DIAGNOSIS — M949 Disorder of cartilage, unspecified: Secondary | ICD-10-CM

## 2010-08-18 ENCOUNTER — Encounter: Payer: Self-pay | Admitting: Internal Medicine

## 2010-08-23 ENCOUNTER — Ambulatory Visit (INDEPENDENT_AMBULATORY_CARE_PROVIDER_SITE_OTHER): Payer: Medicare Other | Admitting: Internal Medicine

## 2010-08-23 ENCOUNTER — Encounter: Payer: Self-pay | Admitting: Internal Medicine

## 2010-08-23 DIAGNOSIS — E785 Hyperlipidemia, unspecified: Secondary | ICD-10-CM

## 2010-08-23 DIAGNOSIS — Z8249 Family history of ischemic heart disease and other diseases of the circulatory system: Secondary | ICD-10-CM

## 2010-08-23 MED ORDER — PRAVASTATIN SODIUM 20 MG PO TABS: 20.0000 mg | ORAL_TABLET | ORAL | Status: DC

## 2010-08-23 NOTE — Patient Instructions (Signed)
If you choose to take a statin; repeat fasting labs after 10 weeks (lipids, AST,ALT, CK; 272.4, 995.20).

## 2010-08-23 NOTE — Progress Notes (Signed)
  Subjective:    Patient ID: [name] [name], female    DOB: 03/25/1940, 70 y.o.   MRN: [id number]  HPI Dyslipidemia assessment: Prior Advanced Lipid Testing: NMR Lipoprofile  LDL goal = < 110, ideally < 85.   Family history of premature CAD/ MI: Mother MI in 14s; MGF MI in 30s; M uncle MI in 25s .  Nutrition: heart healthy diet .  Exercise: walking 4 mpd 3X/ week & yoga . Diabetes : 5.2% . HTN: no. Smoking history  : quit @ 42 .   Weight :  stable. Lab results reviewed :BHL risks LDL 133; hyperabsorber.    Review of Systems ROS: fatigue: no ; chest pain : no ;claudication: no; palpitations: occasional (non exertional); abd pain/bowel changes: no ; myalgias:no;  syncope : no ; memory loss: no;skin changes: skin & hair drier on Evista.     Objective:   Physical Exam  Gen.: Thin but well-nourished; in no acute distress; appears much younger than age Eyes: Extraocular motion intact; no lid lag or proptosis Neck: thyroid physiologically asymmetry Heart: Normal rhythm and rate without significant murmur, gallop, or extra heart sounds Lungs: Chest clear to auscultation without rales,rales, wheezes Neuro:Deep tendon reflexes are equal and within normal limits; no tremor  Skin: Warm and dry without significant lesions or rashes; no onycholysis Psych: Normally communicative and interactive; no abnormal mood or affect clinically. Intelligent & focused ; well read about this topic        Assessment & Plan:  #1 dyslipidemia in the context of a strong maternal family history of coronary disease. LDL goal less than 110. She is a Corporate treasurer. Questions answered. Risks and options discussed. Pravastatin 20 mg one half at bedtime would be a reasonable option.

## 2010-08-29 ENCOUNTER — Telehealth: Payer: Self-pay | Admitting: Internal Medicine

## 2010-08-29 ENCOUNTER — Other Ambulatory Visit: Payer: Self-pay | Admitting: Obstetrics and Gynecology

## 2010-08-29 NOTE — Telephone Encounter (Signed)
See copy of dexa report on Dr. Alwyn Ren ledge

## 2010-09-05 NOTE — Telephone Encounter (Signed)
The bone density does show a T score of - 1.8 @  the right femoral neck. We need to get her last BMD results to see if this is stable or gotten worse. Last BMD would be in 2010 and is probably in [place].

## 2010-09-06 NOTE — Telephone Encounter (Signed)
Left msg on machine to have pt return call .

## 2010-09-06 NOTE — Telephone Encounter (Signed)
Spoke w/ pt aware that [facility] has previous results for comparison and will call with any further information.

## 2010-09-09 NOTE — Telephone Encounter (Signed)
Left detailed msg on pt voicemail.

## 2010-10-31 ENCOUNTER — Other Ambulatory Visit: Payer: Self-pay | Admitting: Internal Medicine

## 2010-10-31 DIAGNOSIS — E785 Hyperlipidemia, unspecified: Secondary | ICD-10-CM

## 2010-10-31 DIAGNOSIS — T887XXA Unspecified adverse effect of drug or medicament, initial encounter: Secondary | ICD-10-CM

## 2010-11-01 ENCOUNTER — Other Ambulatory Visit (INDEPENDENT_AMBULATORY_CARE_PROVIDER_SITE_OTHER): Payer: Medicare Other

## 2010-11-01 DIAGNOSIS — T887XXA Unspecified adverse effect of drug or medicament, initial encounter: Secondary | ICD-10-CM

## 2010-11-01 DIAGNOSIS — E785 Hyperlipidemia, unspecified: Secondary | ICD-10-CM

## 2010-11-01 LAB — ALT: ALT: 22 U/L (ref 0–35)

## 2010-11-01 LAB — LIPID PANEL
Cholesterol: 193 mg/dL (ref 0–200)
Triglycerides: 66 mg/dL (ref 0.0–149.0)

## 2010-11-01 LAB — CK: Total CK: 55 U/L (ref 7–177)

## 2010-11-01 NOTE — Progress Notes (Signed)
Labs only

## 2010-12-10 HISTORY — PX: REFRACTIVE SURGERY: SHX103

## 2011-01-17 ENCOUNTER — Other Ambulatory Visit: Payer: Self-pay | Admitting: Internal Medicine

## 2011-01-17 MED ORDER — HYDROCODONE-HOMATROPINE 5-1.5 MG/5ML PO SYRP: 5.0000 mL | ORAL_SOLUTION | Freq: Four times a day (QID) | ORAL | Status: DC | PRN

## 2011-01-17 NOTE — Telephone Encounter (Signed)
Rx called in to pharmacy. 

## 2011-01-17 NOTE — Telephone Encounter (Signed)
Dr.Hopper please advise 

## 2011-01-17 NOTE — Telephone Encounter (Signed)
Okay; 90 cc one teaspoon every 6-8 hours as needed

## 2011-01-24 DIAGNOSIS — H409 Unspecified glaucoma: Secondary | ICD-10-CM | POA: Diagnosis not present

## 2011-01-24 DIAGNOSIS — H4011X0 Primary open-angle glaucoma, stage unspecified: Secondary | ICD-10-CM | POA: Diagnosis not present

## 2011-03-21 ENCOUNTER — Encounter: Payer: Self-pay | Admitting: Internal Medicine

## 2011-03-24 ENCOUNTER — Encounter: Payer: Self-pay | Admitting: Internal Medicine

## 2011-04-14 DIAGNOSIS — H409 Unspecified glaucoma: Secondary | ICD-10-CM | POA: Diagnosis not present

## 2011-04-14 DIAGNOSIS — H4011X0 Primary open-angle glaucoma, stage unspecified: Secondary | ICD-10-CM | POA: Diagnosis not present

## 2011-05-03 ENCOUNTER — Encounter: Payer: Medicare Other | Admitting: Internal Medicine

## 2011-05-09 ENCOUNTER — Encounter: Payer: Self-pay | Admitting: Internal Medicine

## 2011-05-09 ENCOUNTER — Ambulatory Visit (INDEPENDENT_AMBULATORY_CARE_PROVIDER_SITE_OTHER): Payer: Medicare Other | Admitting: Internal Medicine

## 2011-05-09 VITALS — BP 108/76 | HR 73 | Temp 98.4°F | Resp 12 | Ht 60.3 in | Wt 117.2 lb

## 2011-05-09 DIAGNOSIS — E785 Hyperlipidemia, unspecified: Secondary | ICD-10-CM

## 2011-05-09 DIAGNOSIS — E559 Vitamin D deficiency, unspecified: Secondary | ICD-10-CM | POA: Diagnosis not present

## 2011-05-09 DIAGNOSIS — M1990 Unspecified osteoarthritis, unspecified site: Secondary | ICD-10-CM | POA: Diagnosis not present

## 2011-05-09 DIAGNOSIS — K589 Irritable bowel syndrome without diarrhea: Secondary | ICD-10-CM

## 2011-05-09 DIAGNOSIS — Z0000 Encounter for general adult medical examination without abnormal findings: Secondary | ICD-10-CM | POA: Diagnosis not present

## 2011-05-09 LAB — CBC WITH DIFFERENTIAL/PLATELET
Basophils Relative: 1 % (ref 0.0–3.0)
Eosinophils Relative: 5.6 % — ABNORMAL HIGH (ref 0.0–5.0)
HCT: 40 % (ref 36.0–46.0)
Lymphs Abs: 1.5 10*3/uL (ref 0.7–4.0)
Monocytes Relative: 8.4 % (ref 3.0–12.0)
Neutrophils Relative %: 52.1 % (ref 43.0–77.0)
Platelets: 197 10*3/uL (ref 150.0–400.0)
RBC: 4.23 Mil/uL (ref 3.87–5.11)
WBC: 4.7 10*3/uL (ref 4.5–10.5)

## 2011-05-09 LAB — HEPATIC FUNCTION PANEL
ALT: 24 U/L (ref 0–35)
AST: 28 U/L (ref 0–37)
Bilirubin, Direct: 0.1 mg/dL (ref 0.0–0.3)
Total Bilirubin: 0.9 mg/dL (ref 0.3–1.2)
Total Protein: 6.7 g/dL (ref 6.0–8.3)

## 2011-05-09 LAB — LIPID PANEL
LDL Cholesterol: 103 mg/dL — ABNORMAL HIGH (ref 0–99)
Total CHOL/HDL Ratio: 3

## 2011-05-09 LAB — BASIC METABOLIC PANEL
BUN: 13 mg/dL (ref 6–23)
Chloride: 108 mEq/L (ref 96–112)
GFR: 104.92 mL/min (ref >60.00)
Potassium: 4.1 mEq/L (ref 3.5–5.1)

## 2011-05-09 NOTE — Progress Notes (Signed)
Subjective:    Patient ID: [name] [name], female    DOB: [birth year], 71 y.o.   MRN: [id number]  HPI Medicare Wellness Visit:  The following psychosocial & medical history were reviewed as required by Medicare.   Social history: caffeine: none , alcohol: 7 glasses of wine / week ,  tobacco use : quit 1984  & exercise : walking 2 mpd daily.   Home & personal  safety / fall risk: no issues, activities of daily living: no limitations , seatbelt use : yes , and smoke alarm employment : yes .  Power of Attorney/Living Will status : in place  Vision ( as recorded per Nurse) & Hearing  evaluation :  Ophth surgery 12/12.see exam Orientation :oriented X 3 , memory & recall :good, spelling  testing:good,and mood & affect : normal . Depression / anxiety: denied  Travel history : [place] , immunization status :up to date , transfusion history: no, and preventive health surveillance ( colonoscopies, BMD , etc as per protocol/ [facility]): colonoscopy next month, Dental care:  Every 6 mos . Chart reviewed &  Updated. Active issues reviewed & addressed.       Review of Systems Patient reports no significant  vision/ hearing  changes, adenopathy,fever, weight change,  persistant / recurrent hoarseness , swallowing issues, chest pain,palpitations,edema,persistant /recurrent cough, hemoptysis, dyspnea( rest/ exertional/paroxysmal nocturnal), gastrointestinal bleeding(melena, rectal bleeding), abdominal pain, significant heartburn,  bowel changes,GU symptoms(dysuria, hematuria,pyuria, incontinence), Gyn symptoms(abnormal  bleeding , pain),  syncope, focal weakness, memory loss,numbness & tingling, skin/hair /nail changes,abnormal bruising or bleeding, anxiety,or depression.   She has decreased her exercise program because of left hip pain ; she plans to see an orthopedist     Objective:   Physical Exam Gen.: Thin but healthy and well-nourished in appearance. Alert, appropriate and cooperative throughout  exam. Head: Normocephalic without obvious abnormalities Eyes: No corneal or conjunctival inflammation noted. Pupils equal round reactive to light and accommodation. Fundal exam is benign without hemorrhages, exudate, papilledema. Extraocular motion intact. Vision grossly normal with lenses. Ears: External  ear exam reveals no significant lesions or deformities. Wax on L . Hearing is grossly normal bilaterally. Nose: External nasal exam reveals no deformity or inflammation. Nasal mucosa are pink and moist. No lesions or exudates noted.Mouth: Oral mucosa and oropharynx reveal no lesions or exudates. Teeth in good repair. Neck: No deformities, masses, or tenderness noted. Range of motion & Thyroid normal Lungs: Normal respiratory effort; chest expands symmetrically. Lungs are clear to auscultation without rales, wheezes, or increased work of breathing. Heart: Normal rate and rhythm. Normal S1 and S2. No gallop, click, or rub. Grade 1/2-1 over 6 systolic murmur  Abdomen: Bowel sounds normal; abdomen soft and nontender. No masses, organomegaly or hernias noted.Aorta palpable ; no AAA  Genitalia: Dr Edward Jolly, Gyn. Because of relocation she'll need to establish with another gynecologist Musculoskeletal/extremities: No deformity or scoliosis noted of  the thoracic or lumbar spine. No clubbing, cyanosis, edema, or deformity noted. Range of motion  normal .Tone & strength  normal.Joints normal. Nail health  good. Slight instability left knee laterally; she has some discomfort positionally and the left hip but range of motion is normal Vascular: Carotid, radial artery, dorsalis pedis and  posterior tibial pulses are full and equal. No bruits present. Neurologic: Alert and oriented x3. Deep tendon reflexes symmetrical and normal.          Skin: Intact without suspicious lesions or rashes. Lymph: No cervical, axillary lymphadenopathy present. Psych: Mood and  affect are normal. Normally interactive                                                                                          Assessment & Plan:  #1 Medicare Wellness Exam; criteria met ; data entered #2 Problem List reviewed ; Assessment/ Recommendations made  #3 hip pain/instability by history plan orthopedic referral Plan: see Orders

## 2011-05-09 NOTE — Patient Instructions (Addendum)
Share results with all MDs seen. Please try to go on My Chart within the next 24 hours to allow me to release the results directly to you.  To prevent palpitations or premature beats, avoid stimulants such as decongestants, diet pills, nicotine, or caffeine (coffee, tea, cola, or chocolate) to excess.

## 2011-05-10 ENCOUNTER — Other Ambulatory Visit: Payer: Self-pay | Admitting: *Deleted

## 2011-05-10 DIAGNOSIS — E785 Hyperlipidemia, unspecified: Secondary | ICD-10-CM

## 2011-05-10 DIAGNOSIS — Z8249 Family history of ischemic heart disease and other diseases of the circulatory system: Secondary | ICD-10-CM

## 2011-05-10 LAB — VITAMIN D 25 HYDROXY (VIT D DEFICIENCY, FRACTURES): Vit D, 25-Hydroxy: 90 ng/mL — ABNORMAL HIGH (ref 30–89)

## 2011-05-10 MED ORDER — PRAVASTATIN SODIUM 20 MG PO TABS: 10.0000 mg | ORAL_TABLET | ORAL | Status: DC

## 2011-05-16 ENCOUNTER — Ambulatory Visit (AMBULATORY_SURGERY_CENTER): Payer: Medicare Other | Admitting: *Deleted

## 2011-05-16 VITALS — Ht 60.0 in | Wt 115.0 lb

## 2011-05-16 DIAGNOSIS — Z1211 Encounter for screening for malignant neoplasm of colon: Secondary | ICD-10-CM | POA: Diagnosis not present

## 2011-05-16 MED ORDER — PEG-KCL-NACL-NASULF-NA ASC-C 100 G PO SOLR: ORAL | Status: DC

## 2011-05-19 DIAGNOSIS — M1610 Unilateral primary osteoarthritis, unspecified hip: Secondary | ICD-10-CM | POA: Diagnosis not present

## 2011-05-25 DIAGNOSIS — M1710 Unilateral primary osteoarthritis, unspecified knee: Secondary | ICD-10-CM | POA: Diagnosis not present

## 2011-05-30 ENCOUNTER — Encounter: Payer: Self-pay | Admitting: Internal Medicine

## 2011-05-30 ENCOUNTER — Ambulatory Visit (AMBULATORY_SURGERY_CENTER): Payer: Medicare Other | Admitting: Internal Medicine

## 2011-05-30 VITALS — BP 122/82 | HR 67 | Temp 99.0°F | Resp 12 | Ht 60.0 in | Wt 112.0 lb

## 2011-05-30 DIAGNOSIS — D126 Benign neoplasm of colon, unspecified: Secondary | ICD-10-CM

## 2011-05-30 DIAGNOSIS — Z1211 Encounter for screening for malignant neoplasm of colon: Secondary | ICD-10-CM | POA: Diagnosis not present

## 2011-05-30 DIAGNOSIS — J45909 Unspecified asthma, uncomplicated: Secondary | ICD-10-CM | POA: Diagnosis not present

## 2011-05-30 DIAGNOSIS — E785 Hyperlipidemia, unspecified: Secondary | ICD-10-CM | POA: Diagnosis not present

## 2011-05-30 MED ORDER — SODIUM CHLORIDE 0.9 % IV SOLN: 500.0000 mL | INTRAVENOUS | Status: DC

## 2011-05-30 NOTE — Op Note (Signed)
[facility] [address] [place], [place]  [address]  COLONOSCOPY PROCEDURE REPORT  PATIENT:  [name] [name], [name] [name]  MR#:  [id number] BIRTHDATE:  [birth year], 70 yrs. old  GENDER:  female ENDOSCOPIST:  Hedwig Morton. Juanda Chance, MD REF. BY: PROCEDURE DATE:  05/30/2011 PROCEDURE:  Colonoscopy with biopsy ASA CLASS:  Class II INDICATIONS:  family history of colon cancer father with colon cancer prior colon 2000, 2008 MEDICATIONS:   MAC sedation, administered by CRNA, propofol (Diprivan) 300 mg  DESCRIPTION OF PROCEDURE:   After the risks and benefits and of the procedure were explained, informed consent was obtained. Digital rectal exam was performed and revealed no rectal masses. The LB PCF-[id number] endoscope was introduced through the anus and advanced to the cecum, which was identified by both the appendix and ileocecal valve.  The quality of the prep was excellent, using MoviPrep.  The instrument was then slowly withdrawn as the colon was fully examined. <<PROCEDUREIMAGES>>  FINDINGS:  A diminutive polyp was found (see image1). 3 mm flat polyp at 60 cm right colon  This was otherwise a normal examination of the colon (see image5, image4, image7, and image3). scope induced abrasion at 20 cm   Retroflexed views in the rectum revealed no abnormalities.    The scope was then withdrawn from the patient and the procedure completed.  COMPLICATIONS:  None ENDOSCOPIC IMPRESSION: 1) Diminutive polyp 2) Otherwise normal examination RECOMMENDATIONS: 1) Await pathology results 2) High fiber diet.  REPEAT EXAM:  In 5 year(s) for.  ______________________________ Hedwig Morton. Juanda Chance, MD  CC:  n. eSIGNED:   Hedwig Morton. Flora Parks at 05/30/2011 08:58 AM  [name], [phone]

## 2011-05-30 NOTE — Patient Instructions (Signed)
Discharge instructions given with verbal understanding. Handout on polyps given. Resume previous medications. YOU HAD AN ENDOSCOPIC PROCEDURE TODAY AT THE [facility]: Refer to the procedure report that was given to you for any specific questions about what was found during the examination.  If the procedure report does not answer your questions, please call your gastroenterologist to clarify.  If you requested that your care partner not be given the details of your procedure findings, then the procedure report has been included in a sealed envelope for you to review at your convenience later.  YOU SHOULD EXPECT: Some feelings of bloating in the abdomen. Passage of more gas than usual.  Walking can help get rid of the air that was put into your GI tract during the procedure and reduce the bloating. If you had a lower endoscopy (such as a colonoscopy or flexible sigmoidoscopy) you may notice spotting of blood in your stool or on the toilet paper. If you underwent a bowel prep for your procedure, then you may not have a normal bowel movement for a few days.  DIET: Your first meal following the procedure should be a light meal and then it is ok to progress to your normal diet.  A half-sandwich or bowl of soup is an example of a good first meal.  Heavy or fried foods are harder to digest and may make you feel nauseous or bloated.  Likewise meals heavy in dairy and vegetables can cause extra gas to form and this can also increase the bloating.  Drink plenty of fluids but you should avoid alcoholic beverages for 24 hours.  ACTIVITY: Your care partner should take you home directly after the procedure.  You should plan to take it easy, moving slowly for the rest of the day.  You can resume normal activity the day after the procedure however you should NOT DRIVE or use heavy machinery for 24 hours (because of the sedation medicines used during the test).    SYMPTOMS TO REPORT IMMEDIATELY: A  gastroenterologist can be reached at any hour.  During normal business hours, 8:30 AM to 5:00 PM Monday through Friday, call [phone].  After hours and on weekends, please call the GI answering service at [phone] who will take a message and have the physician on call contact you.   Following lower endoscopy (colonoscopy or flexible sigmoidoscopy):  Excessive amounts of blood in the stool  Significant tenderness or worsening of abdominal pains  Swelling of the abdomen that is new, acute  Fever of 100F or higher  FOLLOW UP: If any biopsies were taken you will be contacted by phone or by letter within the next 1-3 weeks.  Call your gastroenterologist if you have not heard about the biopsies in 3 weeks.  Our staff will call the home number listed on your records the next business day following your procedure to check on you and address any questions or concerns that you may have at that time regarding the information given to you following your procedure. This is a courtesy call and so if there is no answer at the home number and we have not heard from you through the emergency physician on call, we will assume that you have returned to your regular daily activities without incident.  SIGNATURES/CONFIDENTIALITY: You and/or your care partner have signed paperwork which will be entered into your electronic medical record.  These signatures attest to the fact that that the information above on your After Visit Summary has   been reviewed and is understood.  Full responsibility of the confidentiality of this discharge information lies with you and/or your care-partner. 

## 2011-05-30 NOTE — Progress Notes (Signed)
Patient did not experience any of the following events: a burn prior to discharge; a fall within the facility; wrong site/side/patient/procedure/implant event; or a hospital transfer or hospital admission upon discharge from the facility. (G8907) Patient did not have preoperative order for IV antibiotic SSI prophylaxis. (G8918)  

## 2011-05-31 ENCOUNTER — Telehealth: Payer: Self-pay | Admitting: *Deleted

## 2011-05-31 NOTE — Telephone Encounter (Signed)
  Follow up Call-  Call back number 05/30/2011  Post procedure Call Back phone  # [phone]  Permission to leave phone message Yes     Patient questions:  Do you have a fever, pain , or abdominal swelling? no Pain Score  0 *  Have you tolerated food without any problems? yes  Have you been able to return to your normal activities? yes  Do you have any questions about your discharge instructions: Diet   no Medications  no Follow up visit  no  Do you have questions or concerns about your Care? no  Actions: * If pain score is 4 or above: No action needed, pain <4.

## 2011-06-06 ENCOUNTER — Encounter: Payer: Self-pay | Admitting: Internal Medicine

## 2011-06-09 ENCOUNTER — Other Ambulatory Visit: Payer: Self-pay | Admitting: Internal Medicine

## 2011-06-09 DIAGNOSIS — Z1211 Encounter for screening for malignant neoplasm of colon: Secondary | ICD-10-CM

## 2011-06-09 MED ORDER — SPIRONOLACTONE 25 MG PO TABS: 25.0000 mg | ORAL_TABLET | Freq: Every day | ORAL | Status: DC

## 2011-06-09 MED ORDER — PRAVASTATIN SODIUM 20 MG PO TABS: 20.0000 mg | ORAL_TABLET | Freq: Every day | ORAL | Status: DC

## 2011-06-09 NOTE — Telephone Encounter (Signed)
Per ca call form the patient which is extremely angry, [facility] has faxed [place] twice regarding her refills for the following medications  1-Pravastatin 20MG , which is not on the current medications list but appears to have been marked Discontinued 5.1.13  2-spirondactone 5M, which shows sent 5.14.13  Per patient she would like these called in today ph# [phone], fax# [phone] Please call patient back to confirm this has been done patient call back# [phone]  Last OV 4.30.13

## 2011-06-09 NOTE — Telephone Encounter (Signed)
RX's called in (spoke with Eunice Blase), patient aware rx's called in

## 2011-08-01 DIAGNOSIS — H409 Unspecified glaucoma: Secondary | ICD-10-CM | POA: Diagnosis not present

## 2011-08-01 DIAGNOSIS — H4011X0 Primary open-angle glaucoma, stage unspecified: Secondary | ICD-10-CM | POA: Diagnosis not present

## 2011-08-31 DIAGNOSIS — Z1231 Encounter for screening mammogram for malignant neoplasm of breast: Secondary | ICD-10-CM | POA: Diagnosis not present

## 2011-12-12 DIAGNOSIS — H4011X0 Primary open-angle glaucoma, stage unspecified: Secondary | ICD-10-CM | POA: Diagnosis not present

## 2011-12-12 DIAGNOSIS — H409 Unspecified glaucoma: Secondary | ICD-10-CM | POA: Diagnosis not present

## 2012-02-24 ENCOUNTER — Other Ambulatory Visit: Payer: Self-pay

## 2012-04-23 DIAGNOSIS — H409 Unspecified glaucoma: Secondary | ICD-10-CM | POA: Diagnosis not present

## 2012-04-23 DIAGNOSIS — H4011X0 Primary open-angle glaucoma, stage unspecified: Secondary | ICD-10-CM | POA: Diagnosis not present

## 2012-05-16 ENCOUNTER — Encounter: Payer: Self-pay | Admitting: Internal Medicine

## 2012-05-16 ENCOUNTER — Ambulatory Visit (INDEPENDENT_AMBULATORY_CARE_PROVIDER_SITE_OTHER): Payer: Medicare Other | Admitting: Internal Medicine

## 2012-05-16 VITALS — HR 75 | Temp 98.3°F | Resp 14 | Ht 60.08 in | Wt 111.8 lb

## 2012-05-16 DIAGNOSIS — M899 Disorder of bone, unspecified: Secondary | ICD-10-CM

## 2012-05-16 DIAGNOSIS — Z0000 Encounter for general adult medical examination without abnormal findings: Secondary | ICD-10-CM

## 2012-05-16 DIAGNOSIS — Z8611 Personal history of tuberculosis: Secondary | ICD-10-CM

## 2012-05-16 DIAGNOSIS — J45909 Unspecified asthma, uncomplicated: Secondary | ICD-10-CM

## 2012-05-16 DIAGNOSIS — E785 Hyperlipidemia, unspecified: Secondary | ICD-10-CM | POA: Diagnosis not present

## 2012-05-16 DIAGNOSIS — D126 Benign neoplasm of colon, unspecified: Secondary | ICD-10-CM

## 2012-05-16 DIAGNOSIS — R609 Edema, unspecified: Secondary | ICD-10-CM | POA: Diagnosis not present

## 2012-05-16 DIAGNOSIS — Z1239 Encounter for other screening for malignant neoplasm of breast: Secondary | ICD-10-CM

## 2012-05-16 DIAGNOSIS — M1990 Unspecified osteoarthritis, unspecified site: Secondary | ICD-10-CM

## 2012-05-16 DIAGNOSIS — M949 Disorder of cartilage, unspecified: Secondary | ICD-10-CM | POA: Diagnosis not present

## 2012-05-16 LAB — CBC WITH DIFFERENTIAL/PLATELET
Basophils Relative: 0.7 % (ref 0.0–3.0)
Eosinophils Relative: 3.2 % (ref 0.0–5.0)
HCT: 43.2 % (ref 36.0–46.0)
Hemoglobin: 14.4 g/dL (ref 12.0–15.0)
Lymphs Abs: 1.6 10*3/uL (ref 0.7–4.0)
MCV: 95.3 fl (ref 78.0–100.0)
Monocytes Absolute: 0.4 10*3/uL (ref 0.1–1.0)
Monocytes Relative: 7.9 % (ref 3.0–12.0)
Neutro Abs: 3 10*3/uL (ref 1.4–7.7)
RBC: 4.53 Mil/uL (ref 3.87–5.11)
WBC: 5.2 10*3/uL (ref 4.5–10.5)

## 2012-05-16 LAB — HEPATIC FUNCTION PANEL
ALT: 21 U/L (ref 0–35)
AST: 29 U/L (ref 0–37)
Albumin: 4.1 g/dL (ref 3.5–5.2)
Total Bilirubin: 1.8 mg/dL — ABNORMAL HIGH (ref 0.3–1.2)
Total Protein: 7.1 g/dL (ref 6.0–8.3)

## 2012-05-16 LAB — TSH: TSH: 1.41 u[IU]/mL (ref 0.35–5.50)

## 2012-05-16 LAB — LIPID PANEL
Cholesterol: 222 mg/dL — ABNORMAL HIGH (ref 0–200)
Total CHOL/HDL Ratio: 2

## 2012-05-16 LAB — BASIC METABOLIC PANEL
BUN: 15 mg/dL (ref 6–23)
CO2: 28 mEq/L (ref 19–32)
Calcium: 9.6 mg/dL (ref 8.4–10.5)
Glucose, Bld: 95 mg/dL (ref 70–99)
Sodium: 140 mEq/L (ref 135–145)

## 2012-05-16 LAB — LDL CHOLESTEROL, DIRECT: Direct LDL: 113.6 mg/dL

## 2012-05-16 MED ORDER — SPIRONOLACTONE 25 MG PO TABS: 25.0000 mg | ORAL_TABLET | Freq: Every day | ORAL | Status: DC

## 2012-05-16 NOTE — Addendum Note (Signed)
Addended by: Maurice Small on: 05/16/2012 11:05 AM   Modules accepted: Orders

## 2012-05-16 NOTE — Progress Notes (Signed)
Subjective:    Patient ID: [name] [name], female    DOB: Aug 18, 1940, 72 y.o.   MRN: [id number]  HPI Medicare Wellness Visit:  Psychosocial & medical history were reviewed as required by Medicare (abuse,antisocial behavioral risks,firearm risk).  Social history: caffeine:none  , alcohol: 5 glasses wine/ week  ,  tobacco use:  Quit 1984 Exercise : see below  No home & personal  safety / fall risk Activities of daily living: no limitations  Seatbelt  and smoke alarm employed. Power of Attorney/Living Will status : in place Ophthalmology exam current (every 4 mos for glaucoma) Hearing evaluation not current Orientation :oriented X 3  Memory & recall :good Spelling testing:good Mood & affect : normal . Depression / anxiety: denied Travel history : last [place]  Immunization status :Flu/ PNA not taken (SOC reviewed) Transfusion history:  none  Preventive health surveillance ( colonoscopy,  PAP as per protocol/ SOC):  mammogram /BMD/ PAP due Dental care:  Every 6 mos. Chart reviewed &  Updated. Active issues reviewed & addressed.      Review of Systems She is on a heart healthy diet; she exercises as strength & aerobic 105-120 minutes 4 times per week without symptoms. Specifically she denies chest pain, palpitations, dyspnea, or claudication. Family history is positive for premature coronary disease in maternal family. Advanced cholesterol testing reveals her LDL goal is less than 100 .                              There is medication compliance with the statin. Significant abdominal symptoms, memory deficit, or myalgias denied. .     Objective:   Physical Exam Gen.: Thin but healthy and well-nourished in appearance. Alert, appropriate and cooperative throughout exam.Appears younger than stated age  Head: Normocephalic without obvious abnormalities  Eyes: No corneal or conjunctival inflammation noted.  Extraocular motion intact. Vision grossly normal with lenses Ears:  External  ear exam reveals no significant lesions or deformities. Wax on L; R TM normal. Hearing is grossly normal bilaterally. Nose: External nasal exam reveals no deformity or inflammation. Nasal mucosa are pink and moist. No lesions or exudates noted.   Mouth: Oral mucosa and oropharynx reveal no lesions or exudates. Teeth in good repair. Neck: No deformities, masses, or tenderness noted. Range of motion & Thyroid normal. Lungs: Normal respiratory effort; chest expands symmetrically. Lungs are clear to auscultation without rales, wheezes, or increased work of breathing. Heart: Normal rate and rhythm. Normal S1 and S2. No gallop, click, or rub. Grade 1/2 systolic murmur. Abdomen: Bowel sounds normal; abdomen soft and nontender. No masses, organomegaly or hernias noted.Aorta palpable ; no AAA Genitalia: As per Gyn                                  Musculoskeletal/extremities: No deformity or scoliosis noted of  the thoracic or lumbar spine.  No clubbing, cyanosis, edema, or significant extremity  deformity noted. Range of motion decreased L hip .Tone & strength  Normal. Joints normal. Nail health good. Able to lie down & sit up w/o help. Negative SLR bilaterally Vascular: Carotid, radial artery, dorsalis pedis and  posterior tibial pulses are full and equal. No bruits present. Neurologic: Alert and oriented x3. Deep tendon reflexes symmetrical and normal.       Skin: Intact without suspicious lesions or rashes. Lymph: No cervical, axillary  lymphadenopathy present. Psych: Mood and affect are normal. Normally interactive                                                                                        Assessment & Plan:  #1 Medicare Wellness Exam; criteria met ; data entered #2 Problem List reviewed ; Assessment/ Recommendations made Plan: see Orders

## 2012-05-16 NOTE — Patient Instructions (Addendum)
Please review the record and make any corrections; share this with all medical staff seen.  Please consider consultation with Dr. Kathrine Haddock @ [facility]. This will provide the best data on preventive therapies& eliminate unnecessary interventions

## 2012-05-17 ENCOUNTER — Encounter: Payer: Self-pay | Admitting: Internal Medicine

## 2012-05-17 ENCOUNTER — Other Ambulatory Visit: Payer: Self-pay | Admitting: Internal Medicine

## 2012-05-17 ENCOUNTER — Other Ambulatory Visit: Payer: Self-pay

## 2012-05-17 MED ORDER — SPIRONOLACTONE 25 MG PO TABS: 25.0000 mg | ORAL_TABLET | Freq: Every day | ORAL | Status: DC

## 2012-05-17 MED ORDER — PRAVASTATIN SODIUM 20 MG PO TABS: 20.0000 mg | ORAL_TABLET | Freq: Every day | ORAL | Status: DC

## 2012-05-17 NOTE — Telephone Encounter (Signed)
Rx's sent to mail order company

## 2012-05-17 NOTE — Telephone Encounter (Signed)
RX's sent electronically  

## 2012-05-18 ENCOUNTER — Emergency Department (HOSPITAL_BASED_OUTPATIENT_CLINIC_OR_DEPARTMENT_OTHER)
Admission: EM | Admit: 2012-05-18 | Discharge: 2012-05-18 | Disposition: A | Discharge status: Home / Self Care | Payer: Medicare Other | Attending: Emergency Medicine | Admitting: Emergency Medicine

## 2012-05-18 ENCOUNTER — Encounter (HOSPITAL_BASED_OUTPATIENT_CLINIC_OR_DEPARTMENT_OTHER): Payer: Self-pay | Admitting: *Deleted

## 2012-05-18 DIAGNOSIS — Z8739 Personal history of other diseases of the musculoskeletal system and connective tissue: Secondary | ICD-10-CM | POA: Diagnosis not present

## 2012-05-18 DIAGNOSIS — Z8742 Personal history of other diseases of the female genital tract: Secondary | ICD-10-CM | POA: Diagnosis not present

## 2012-05-18 DIAGNOSIS — H9210 Otorrhea, unspecified ear: Secondary | ICD-10-CM | POA: Insufficient documentation

## 2012-05-18 DIAGNOSIS — Z8669 Personal history of other diseases of the nervous system and sense organs: Secondary | ICD-10-CM | POA: Diagnosis not present

## 2012-05-18 DIAGNOSIS — Z87891 Personal history of nicotine dependence: Secondary | ICD-10-CM | POA: Insufficient documentation

## 2012-05-18 DIAGNOSIS — M1990 Unspecified osteoarthritis, unspecified site: Secondary | ICD-10-CM | POA: Diagnosis not present

## 2012-05-18 DIAGNOSIS — Z91040 Latex allergy status: Secondary | ICD-10-CM | POA: Diagnosis not present

## 2012-05-18 DIAGNOSIS — E559 Vitamin D deficiency, unspecified: Secondary | ICD-10-CM | POA: Diagnosis not present

## 2012-05-18 DIAGNOSIS — H6122 Impacted cerumen, left ear: Secondary | ICD-10-CM

## 2012-05-18 DIAGNOSIS — H9209 Otalgia, unspecified ear: Secondary | ICD-10-CM | POA: Insufficient documentation

## 2012-05-18 DIAGNOSIS — Z860100 Personal history of colon polyps, unspecified: Secondary | ICD-10-CM | POA: Insufficient documentation

## 2012-05-18 DIAGNOSIS — H6120 Impacted cerumen, unspecified ear: Secondary | ICD-10-CM | POA: Diagnosis not present

## 2012-05-18 DIAGNOSIS — E785 Hyperlipidemia, unspecified: Secondary | ICD-10-CM | POA: Insufficient documentation

## 2012-05-18 DIAGNOSIS — R011 Cardiac murmur, unspecified: Secondary | ICD-10-CM | POA: Insufficient documentation

## 2012-05-18 DIAGNOSIS — Z8611 Personal history of tuberculosis: Secondary | ICD-10-CM | POA: Insufficient documentation

## 2012-05-18 DIAGNOSIS — Z79899 Other long term (current) drug therapy: Secondary | ICD-10-CM | POA: Diagnosis not present

## 2012-05-18 DIAGNOSIS — H409 Unspecified glaucoma: Secondary | ICD-10-CM | POA: Insufficient documentation

## 2012-05-18 NOTE — ED Notes (Signed)
Pt states she was seen at Dr. Frederik Pear office for her physical and he noticed she had wax buildup in her left ear. Has tried OTC meds without relief.

## 2012-05-18 NOTE — ED Provider Notes (Signed)
History     CSN: [id number]  Arrival date & time 05/18/12  1827   First MD Initiated Contact with Patient 05/18/12 1933      Chief Complaint  Patient presents with  . Cerumen Impaction    (Consider location/radiation/quality/duration/timing/severity/associated sxs/prior treatment) Patient is a 72 y.o. female presenting with plugged ear sensation. The history is provided by the patient. No language interpreter was used.  Ear Fullness This is a new problem. The current episode started in the past 7 days. The problem occurs constantly. The problem has been gradually worsening. Nothing aggravates the symptoms. She has tried nothing for the symptoms. The treatment provided no relief.  Pt used ear wax drops and ear is now uncomfortable  Past Medical History  Diagnosis Date  . 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 Other and unspecified hyperlipidemia   . Osteopenia   . Vitamin D deficiency   . Fibrocystic breast disease     resolved off caffeine & nicotine  . Glaucoma(365)     Dr Sol Blazing  . Allergy     hayf ever  . Arthritis     osteoarthritis  . Cataract   . Heart murmur     2013 Frequent PAC's  . History of colon polyps     Past Surgical History  Procedure Laterality Date  . Appendectomy    . Breast biopsy      X 2   . Cholecystectomy    . Tubal ligation    . Tonsillectomy    . Refractive surgery  12/2010    for Glaucoma, Dr Hazle Quant  . Cesarean section  1971  . Dilation and curettage of uterus  1972    twice  . Nasal septum surgery  1965  . Myringotomy  1946    bilateral  . Colonoscopy  4098,1191, 2013    all Negative , Dr.Brodie. Due 2018    Family History  Problem Relation Age of Onset  . Colon cancer Father 66  . Parkinsonism Father   . Prostate cancer Father   . Glaucoma Father   . Osteopenia Mother   . Heart attack Mother 72    non smoker  . Peripheral vascular disease Mother     Status post carotid endarterectomy  . Thyroid  disease Sister     X 2 sisters  . Hypertension Sister     X 2 sisters  . Osteopenia Sister   . Hyperlipidemia Sister   . Arthritis Brother     OA  . Gout Brother   . Hyperlipidemia Brother   . Hypertension Brother   . Diabetes Paternal Aunt   . Hypertension Maternal Grandfather   . Heart attack Maternal Grandfather 43  . Aortic aneurysm Maternal Grandfather   . Heart attack Paternal Uncle 40  . Diabetes Paternal Grandmother   . Cancer Paternal Grandmother     esophageal  . Pancreatic cancer Paternal Grandmother   . Coronary artery disease Maternal Grandmother   . Hypertension Maternal Grandmother   . Osteopenia Maternal Grandmother   . Lung disease Sister     fungal infection acquired  in [place]  . Osteoarthritis Sister     History  Substance Use Topics  . Smoking status: Former Smoker    Quit date: 01/09/1982  . Smokeless tobacco: Never Used     Comment: smoked 1958-1984, up to 1 ppd  . Alcohol Use: 3.0 oz/week    5 Glasses of wine per week  Comment: Socially    OB History   Grav Para Term Preterm Abortions TAB SAB Ect Mult Living                  Review of Systems  Unable to perform ROS HENT: Positive for ear pain and ear discharge.   All other systems reviewed and are negative.    Allergies  Iodine; Bee venom; and Latex  Home Medications   Current Outpatient Rx  Name  Route  Sig  Dispense  Refill  . AMBULATORY NON FORMULARY MEDICATION      Medication Name: Hyalonric Acid 225 mg , 1 by mouth daily OTC         . Ascorbic Acid (VITAMIN C) 1000 MG tablet   Oral   Take 2,000 mg by mouth daily.         . Cholecalciferol (D3-1000 PO)   Oral   Take by mouth daily.         . COLLAGEN PO   Oral   Take 2,000 mg by mouth daily.         . Glucos-Chond-Sterol-Fish Oil (GLUCOSAMINE CHONDROITIN PLUS PO)   Oral   Take by mouth daily.         . Liver Extract (LIVER PO)   Oral   Take by mouth. Liver Care, 1 by mouth daily         .  Magnesium 300 MG CAPS   Oral   Take by mouth daily.         . Multiple Vitamin (MULTIVITAMIN) tablet   Oral   Take 1 tablet by mouth daily.           . Multiple Vitamins-Minerals (WOMENS BONE HEALTH PO)   Oral   Take by mouth 2 (two) times daily.         . Omega-3 Fatty Acids (FISH OIL EXTRA STRENGTH PO)   Oral   Take by mouth daily.         . pravastatin (PRAVACHOL) 20 MG tablet   Oral   Take 1 tablet (20 mg total) by mouth daily.   90 tablet   3   . spironolactone (ALDACTONE) 25 MG tablet   Oral   Take 1 tablet (25 mg total) by mouth daily.   90 tablet   3   . Timolol Maleate 0.5 % (DAILY) SOLN   Ophthalmic   Apply 1 drop to eye 2 (two) times daily. BOTH EYES           BP 115/73  Pulse 78  Temp(Src) 98.4 F (36.9 C) (Oral)  Resp 20  Ht 5' 0.75" (1.543 m)  Wt 111 lb (50.349 kg)  BMI 21.15 kg/m2  SpO2 100%  Physical Exam  Nursing note and vitals reviewed. Constitutional: She appears well-developed and well-nourished.  HENT:  Head: Normocephalic and atraumatic.  Left tm occluded with wax  Eyes: Conjunctivae are normal. Pupils are equal, round, and reactive to light.  Cardiovascular: Normal rate.   Pulmonary/Chest: Effort normal.  Neurological: She is alert.  Skin: Skin is warm.    ED Course  Procedures (including critical care time)  Labs Reviewed - No data to display No results found.   1. Cerumen impaction, left       MDM  RN irrigatted ear.  Pt feels much better       Elson Areas, Cordelia Poche 05/18/12 2044  Medical screening examination/treatment/procedure(s) were performed by non-physician practitioner and as supervising physician I was immediately  available for consultation/collaboration.  Derwood Kaplan, MD 05/21/12 [phone]

## 2012-05-20 NOTE — Addendum Note (Signed)
Addended by: Maurice Small on: 05/20/2012 08:53 AM   Modules accepted: Orders

## 2012-05-21 LAB — VITAMIN D 1,25 DIHYDROXY: Vitamin D2 1, 25 (OH)2: <8 pg/mL

## 2012-05-28 DIAGNOSIS — M1610 Unilateral primary osteoarthritis, unspecified hip: Secondary | ICD-10-CM | POA: Diagnosis not present

## 2012-06-04 DIAGNOSIS — H905 Unspecified sensorineural hearing loss: Secondary | ICD-10-CM | POA: Diagnosis not present

## 2012-06-05 ENCOUNTER — Other Ambulatory Visit: Payer: Self-pay | Admitting: General Practice

## 2012-06-05 ENCOUNTER — Telehealth: Payer: Self-pay | Admitting: General Practice

## 2012-06-05 MED ORDER — EPINEPHRINE 0.3 MG/0.3ML IJ SOAJ: 0.3000 mg | Freq: Once | INTRAMUSCULAR | Status: AC

## 2012-06-05 NOTE — Telephone Encounter (Signed)
Pt called stating she had been to travel clinic as requested by Dr. Alwyn Ren regarding her immunizations for traveling. Pt stated they would not give her an epi-pen. This was sent to [facility] in [place]. Pt notified.

## 2012-06-11 ENCOUNTER — Telehealth: Payer: Self-pay | Admitting: *Deleted

## 2012-06-11 NOTE — Telephone Encounter (Signed)
[name] [name] has spoken with her insurance company, [employer] billing and states that the visit dated 05/09/11 has coding issues.  Best number for pt is [phone].

## 2012-06-23 ENCOUNTER — Encounter: Payer: Self-pay | Admitting: Internal Medicine

## 2012-07-01 ENCOUNTER — Encounter: Payer: Self-pay | Admitting: Internal Medicine

## 2012-07-05 ENCOUNTER — Encounter: Payer: Self-pay | Admitting: Internal Medicine

## 2012-08-14 ENCOUNTER — Other Ambulatory Visit: Payer: Self-pay

## 2012-08-27 DIAGNOSIS — H4011X0 Primary open-angle glaucoma, stage unspecified: Secondary | ICD-10-CM | POA: Diagnosis not present

## 2012-08-27 DIAGNOSIS — H409 Unspecified glaucoma: Secondary | ICD-10-CM | POA: Diagnosis not present

## 2012-09-02 ENCOUNTER — Other Ambulatory Visit: Payer: Medicare Other

## 2012-09-03 ENCOUNTER — Ambulatory Visit (HOSPITAL_COMMUNITY): Payer: Medicare Other

## 2012-09-10 ENCOUNTER — Ambulatory Visit (HOSPITAL_COMMUNITY)
Admission: RE | Admit: 2012-09-10 | Discharge: 2012-09-10 | Disposition: A | Discharge status: Home / Self Care | Payer: Medicare Other | Source: Ambulatory Visit | Attending: Internal Medicine | Admitting: Internal Medicine

## 2012-09-10 DIAGNOSIS — Z1231 Encounter for screening mammogram for malignant neoplasm of breast: Secondary | ICD-10-CM | POA: Diagnosis not present

## 2012-09-10 DIAGNOSIS — Z1239 Encounter for other screening for malignant neoplasm of breast: Secondary | ICD-10-CM

## 2012-09-11 ENCOUNTER — Ambulatory Visit (INDEPENDENT_AMBULATORY_CARE_PROVIDER_SITE_OTHER)
Admission: RE | Admit: 2012-09-11 | Discharge: 2012-09-11 | Disposition: A | Discharge status: Home / Self Care | Payer: Medicare Other | Source: Ambulatory Visit | Attending: Internal Medicine | Admitting: Internal Medicine

## 2012-09-11 DIAGNOSIS — M899 Disorder of bone, unspecified: Secondary | ICD-10-CM

## 2012-09-12 ENCOUNTER — Encounter: Payer: Self-pay | Admitting: Internal Medicine

## 2012-09-24 DIAGNOSIS — H4011X0 Primary open-angle glaucoma, stage unspecified: Secondary | ICD-10-CM | POA: Diagnosis not present

## 2012-09-24 DIAGNOSIS — H409 Unspecified glaucoma: Secondary | ICD-10-CM | POA: Diagnosis not present

## 2012-11-27 DIAGNOSIS — M25559 Pain in unspecified hip: Secondary | ICD-10-CM | POA: Diagnosis not present

## 2013-01-28 DIAGNOSIS — H4011X0 Primary open-angle glaucoma, stage unspecified: Secondary | ICD-10-CM | POA: Diagnosis not present

## 2013-01-28 DIAGNOSIS — H409 Unspecified glaucoma: Secondary | ICD-10-CM | POA: Diagnosis not present

## 2013-05-22 DIAGNOSIS — N905 Atrophy of vulva: Secondary | ICD-10-CM | POA: Diagnosis not present

## 2013-05-26 ENCOUNTER — Encounter: Payer: Medicare Other | Admitting: Internal Medicine

## 2013-05-26 ENCOUNTER — Ambulatory Visit (INDEPENDENT_AMBULATORY_CARE_PROVIDER_SITE_OTHER): Payer: Medicare Other | Admitting: Internal Medicine

## 2013-05-26 ENCOUNTER — Other Ambulatory Visit (INDEPENDENT_AMBULATORY_CARE_PROVIDER_SITE_OTHER): Payer: Medicare Other

## 2013-05-26 ENCOUNTER — Encounter: Payer: Self-pay | Admitting: Internal Medicine

## 2013-05-26 VITALS — BP 146/88 | HR 72 | Temp 98.2°F | Resp 14 | Ht 60.0 in | Wt 113.4 lb

## 2013-05-26 DIAGNOSIS — Z0000 Encounter for general adult medical examination without abnormal findings: Secondary | ICD-10-CM

## 2013-05-26 DIAGNOSIS — E559 Vitamin D deficiency, unspecified: Secondary | ICD-10-CM

## 2013-05-26 DIAGNOSIS — H409 Unspecified glaucoma: Secondary | ICD-10-CM | POA: Insufficient documentation

## 2013-05-26 DIAGNOSIS — E785 Hyperlipidemia, unspecified: Secondary | ICD-10-CM

## 2013-05-26 DIAGNOSIS — R609 Edema, unspecified: Secondary | ICD-10-CM | POA: Diagnosis not present

## 2013-05-26 LAB — LIPID PANEL
CHOLESTEROL: 219 mg/dL — AB (ref 0–200)
HDL: 89.5 mg/dL (ref >39.00)
LDL CALC: 118 mg/dL — AB (ref 0–99)
Total CHOL/HDL Ratio: 2
Triglycerides: 56 mg/dL (ref 0.0–149.0)
VLDL: 11.2 mg/dL (ref 0.0–40.0)

## 2013-05-26 LAB — HEPATIC FUNCTION PANEL
ALBUMIN: 4.2 g/dL (ref 3.5–5.2)
ALT: 22 U/L (ref 0–35)
AST: 30 U/L (ref 0–37)
Alkaline Phosphatase: 49 U/L (ref 39–117)
BILIRUBIN TOTAL: 1.4 mg/dL — AB (ref 0.2–1.2)
Bilirubin, Direct: 0.2 mg/dL (ref 0.0–0.3)
Total Protein: 7.1 g/dL (ref 6.0–8.3)

## 2013-05-26 LAB — BASIC METABOLIC PANEL
BUN: 15 mg/dL (ref 6–23)
CO2: 27 mEq/L (ref 19–32)
Calcium: 9.8 mg/dL (ref 8.4–10.5)
Chloride: 108 mEq/L (ref 96–112)
Creatinine, Ser: 0.9 mg/dL (ref 0.4–1.2)
GFR: 66.18 mL/min (ref >60.00)
GLUCOSE: 108 mg/dL — AB (ref 70–99)
POTASSIUM: 4.8 meq/L (ref 3.5–5.1)
Sodium: 142 mEq/L (ref 135–145)

## 2013-05-26 LAB — TSH: TSH: 1.24 u[IU]/mL (ref 0.35–4.50)

## 2013-05-26 MED ORDER — SPIRONOLACTONE 25 MG PO TABS: 25.0000 mg | ORAL_TABLET | Freq: Every day | ORAL | Status: DC

## 2013-05-26 NOTE — Progress Notes (Signed)
Pre visit review using our clinic review tool, if applicable. No additional management support is needed unless otherwise documented below in the visit note. 

## 2013-05-26 NOTE — Patient Instructions (Signed)
Your next office appointment will be determined based upon review of your pending labs. Those instructions will be transmitted to you through My Chart . 

## 2013-05-26 NOTE — Assessment & Plan Note (Signed)
Vitamin D level 

## 2013-05-26 NOTE — Progress Notes (Signed)
Subjective:    Patient ID: [name] [name], female    DOB: Aug 19, 1940, 73 y.o.   MRN: [id number]  HPI [facility] Medicare Wellness Visit: Psychosocial and medical history were reviewed as required by Medicare (history related to abuse, antisocial behavior , firearm risk). Social history: Caffeine: none  , Alcohol: 5/week , Tobacco use: Exercise:quit 1984 Personal safety/fall risk:no Limitations of activities of daily living:no Seatbelt/ smoke alarm use:yes Healthcare Power of Attorney/Living Will status: UTD Ophthalmologic exam status:UTD; glaucoma treated by Dr Bing Plume Hearing evaluation status:UTD Orientation: Oriented X 3 Memory and recall: good Spelling  testing: good Depression/anxiety assessment: no but stressed due to family issues Foreign travel history: 11/14 [place] Immunization status for influenza/pneumonia/ shingles /tetanus:UTD Transfusion history:no Preventive health care maintenance status: Colonoscopy/BMD/mammogram/Pap as per protocol/standard care:UTD Dental care: every 6 mos Chart reviewed and updated. Active issues reviewed and addressed as documented below.  A heart healthy diet is followed; exercise encompasses 90 minutes 5 times per week as cardio/weights without symptoms.  Family history is STRONGLY + for premature coronary disease. Advanced cholesterol testing reveals  LDL goal is less than 100 ; ideally < 70 . There is medication compliance with the statin.  Low dose ASA not taken due to GI intolerance. Spironolactone for edema, not HTN. BP @ home 120/72.  Review of Systems Specifically denied are  chest pain, palpitations, dyspnea, or claudication.  Significant abdominal symptoms, memory deficit, or myalgias not present. 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 She is exploring possible nonsurgical interventions.         Objective:   Physical Exam   Positive physical findings included a small ganglion at the left radial wrist There is  slight accentuation of the second heart sound. Aorta palpable ; no AAA. She has minor crepitus of the knees with no effusion. There is extremely good range of motion of the lower extremities/hips.  Gen.: Thin but healthy and well-nourished in appearance. Alert, appropriate and cooperative throughout exam. Appears younger than stated age  Head: Normocephalic without obvious abnormalities Eyes: No corneal or conjunctival inflammation noted. Pupils equal round reactive to light and accommodation. Extraocular motion intact. Ears: External  ear exam reveals no significant lesions or deformities. Canals clear .TMs normal. Hearing is grossly normal bilaterally. Nose: External nasal exam reveals no deformity or inflammation. Nasal mucosa are pink and moist. No lesions or exudates noted.   Mouth: Oral mucosa and oropharynx reveal no lesions or exudates. Teeth in good repair. Neck: No deformities, masses, or tenderness noted. Range of motion & Thyroid normaltory effort; chest expands symmetrically. Lungs are clear to auscultation without rales, wheezes, or increased work of breathing. Heart: Normal rate and rhythm. Normal S1 and S2. No gallop, click, or rub. No murmur. Abdomen: Bowel sounds normal; abdomen soft and nontender. No masses, organomegaly or hernias noted. Genitalia:as per Gyn                                  Musculoskeletal/extremities: No deformity or scoliosis noted of  the thoracic or lumbar spine.  No clubbing, cyanosis, edema, or significant extremity  deformity noted. Range of motion normal .Tone & strength normal. Hand joints normal.  Fingernail / toenail health good. Able to lie down & sit up w/o help. Negative SLR bilaterally Vascular: Carotid, radial artery, dorsalis pedis and  posterior tibial pulses are full and equal. No bruits present. Neurologic: Alert and oriented x3. Deep  tendon reflexes symmetrical and normal.  Gait normal   Skin: Intact without suspicious lesions or  rashes. Lymph: No cervical, axillary lymphadenopathy present. Psych: Mood and affect are normal. Normally interactive                                                                                        Assessment & Plan:  #1 Medicare Wellness Exam; criteria met ; data entered #2 Problem List/Diagnoses reviewed Plan:  Assessments made/ Orders entered See Current Assessment & Plan in Problem List under specific DiagnosisThe labs will be reviewed and risks and options assessed. Written recommendations will be provided by mail or directly through My Chart.Further evaluation or change in medical therapy will be directed by those results.

## 2013-05-26 NOTE — Assessment & Plan Note (Signed)
Lipids, LFTs, TSH  

## 2013-05-26 NOTE — Assessment & Plan Note (Signed)
BMET 

## 2013-05-29 DIAGNOSIS — H903 Sensorineural hearing loss, bilateral: Secondary | ICD-10-CM | POA: Diagnosis not present

## 2013-05-29 DIAGNOSIS — H905 Unspecified sensorineural hearing loss: Secondary | ICD-10-CM | POA: Diagnosis not present

## 2013-05-30 LAB — VITAMIN D 1,25 DIHYDROXY
Vitamin D 1, 25 (OH)2 Total: 45 pg/mL (ref 18–72)
Vitamin D3 1, 25 (OH)2: 45 pg/mL

## 2013-05-31 ENCOUNTER — Encounter: Payer: Self-pay | Admitting: Internal Medicine

## 2013-06-03 ENCOUNTER — Encounter: Payer: Self-pay | Admitting: Internal Medicine

## 2013-06-03 ENCOUNTER — Telehealth: Payer: Self-pay | Admitting: Internal Medicine

## 2013-06-03 ENCOUNTER — Telehealth: Payer: Self-pay | Admitting: *Deleted

## 2013-06-03 DIAGNOSIS — H4011X0 Primary open-angle glaucoma, stage unspecified: Secondary | ICD-10-CM | POA: Diagnosis not present

## 2013-06-03 DIAGNOSIS — H409 Unspecified glaucoma: Secondary | ICD-10-CM | POA: Diagnosis not present

## 2013-06-03 MED ORDER — SIMVASTATIN 20 MG PO TABS: 20.0000 mg | ORAL_TABLET | Freq: Every day | ORAL | Status: DC

## 2013-06-03 NOTE — Telephone Encounter (Signed)
Pt request referral for mammogram. Pt stated she used to do it at her old OBGYN but he is no longer there and she doesn't want to go get it done there. Please refer her to other location. Please call pt

## 2013-06-03 NOTE — Addendum Note (Signed)
Addended by: Roma Schanz R on: 06/03/2013 11:29 AM   Modules accepted: Orders

## 2013-06-03 NOTE — Telephone Encounter (Signed)
Pt called requesting a Mammogram order.  Please advise

## 2013-06-04 ENCOUNTER — Other Ambulatory Visit: Payer: Self-pay | Admitting: Internal Medicine

## 2013-06-04 DIAGNOSIS — N6019 Diffuse cystic mastopathy of unspecified breast: Secondary | ICD-10-CM

## 2013-07-01 DIAGNOSIS — H409 Unspecified glaucoma: Secondary | ICD-10-CM | POA: Diagnosis not present

## 2013-07-01 DIAGNOSIS — H4011X0 Primary open-angle glaucoma, stage unspecified: Secondary | ICD-10-CM | POA: Diagnosis not present

## 2013-08-25 ENCOUNTER — Other Ambulatory Visit: Payer: Self-pay | Admitting: Internal Medicine

## 2013-08-25 DIAGNOSIS — Z1231 Encounter for screening mammogram for malignant neoplasm of breast: Secondary | ICD-10-CM

## 2013-09-12 ENCOUNTER — Ambulatory Visit: Payer: Medicare Other | Admitting: Internal Medicine

## 2013-10-07 ENCOUNTER — Ambulatory Visit (HOSPITAL_COMMUNITY)
Admission: RE | Admit: 2013-10-07 | Discharge: 2013-10-07 | Disposition: A | Discharge status: Home / Self Care | Payer: Medicare Other | Source: Ambulatory Visit | Attending: Internal Medicine | Admitting: Internal Medicine

## 2013-10-07 ENCOUNTER — Other Ambulatory Visit: Payer: Self-pay | Admitting: Internal Medicine

## 2013-10-07 DIAGNOSIS — Z1231 Encounter for screening mammogram for malignant neoplasm of breast: Secondary | ICD-10-CM | POA: Insufficient documentation

## 2013-10-24 ENCOUNTER — Other Ambulatory Visit: Payer: Self-pay

## 2013-10-28 DIAGNOSIS — H2513 Age-related nuclear cataract, bilateral: Secondary | ICD-10-CM | POA: Diagnosis not present

## 2013-10-28 DIAGNOSIS — H4011X2 Primary open-angle glaucoma, moderate stage: Secondary | ICD-10-CM | POA: Diagnosis not present

## 2013-11-04 DIAGNOSIS — M79641 Pain in right hand: Secondary | ICD-10-CM | POA: Diagnosis not present

## 2013-11-04 DIAGNOSIS — M25552 Pain in left hip: Secondary | ICD-10-CM | POA: Diagnosis not present

## 2013-11-10 DIAGNOSIS — M151 Heberden's nodes (with arthropathy): Secondary | ICD-10-CM | POA: Diagnosis not present

## 2013-11-10 DIAGNOSIS — M19031 Primary osteoarthritis, right wrist: Secondary | ICD-10-CM | POA: Diagnosis not present

## 2013-11-10 DIAGNOSIS — D2111 Benign neoplasm of connective and other soft tissue of right upper limb, including shoulder: Secondary | ICD-10-CM | POA: Diagnosis not present

## 2013-11-11 ENCOUNTER — Other Ambulatory Visit: Payer: Self-pay | Admitting: Orthopedic Surgery

## 2013-12-11 ENCOUNTER — Encounter (HOSPITAL_BASED_OUTPATIENT_CLINIC_OR_DEPARTMENT_OTHER): Payer: Self-pay

## 2013-12-11 ENCOUNTER — Ambulatory Visit (HOSPITAL_BASED_OUTPATIENT_CLINIC_OR_DEPARTMENT_OTHER): Admit: 2013-12-11 | Payer: Self-pay | Admitting: Orthopedic Surgery

## 2013-12-11 SURGERY — EXCISION MASS
Anesthesia: Choice | Laterality: Right

## 2014-01-19 DIAGNOSIS — M79641 Pain in right hand: Secondary | ICD-10-CM | POA: Diagnosis not present

## 2014-03-02 DIAGNOSIS — D481 Neoplasm of uncertain behavior of connective and other soft tissue: Secondary | ICD-10-CM | POA: Diagnosis not present

## 2014-03-03 ENCOUNTER — Other Ambulatory Visit: Payer: Self-pay | Admitting: Internal Medicine

## 2014-03-03 DIAGNOSIS — H2513 Age-related nuclear cataract, bilateral: Secondary | ICD-10-CM | POA: Diagnosis not present

## 2014-03-03 DIAGNOSIS — H4011X2 Primary open-angle glaucoma, moderate stage: Secondary | ICD-10-CM | POA: Diagnosis not present

## 2014-03-03 DIAGNOSIS — H524 Presbyopia: Secondary | ICD-10-CM | POA: Diagnosis not present

## 2014-03-09 DIAGNOSIS — Z4789 Encounter for other orthopedic aftercare: Secondary | ICD-10-CM | POA: Diagnosis not present

## 2014-03-16 DIAGNOSIS — Z4789 Encounter for other orthopedic aftercare: Secondary | ICD-10-CM | POA: Diagnosis not present

## 2014-04-13 DIAGNOSIS — Z4789 Encounter for other orthopedic aftercare: Secondary | ICD-10-CM | POA: Diagnosis not present

## 2014-05-26 ENCOUNTER — Other Ambulatory Visit: Payer: Self-pay | Admitting: Internal Medicine

## 2014-05-28 ENCOUNTER — Encounter: Payer: Self-pay | Admitting: Internal Medicine

## 2014-05-28 ENCOUNTER — Other Ambulatory Visit (INDEPENDENT_AMBULATORY_CARE_PROVIDER_SITE_OTHER): Payer: Medicare Other

## 2014-05-28 ENCOUNTER — Ambulatory Visit (INDEPENDENT_AMBULATORY_CARE_PROVIDER_SITE_OTHER): Payer: Medicare Other | Admitting: Internal Medicine

## 2014-05-28 VITALS — BP 122/78 | Temp 98.3°F | Resp 15 | Wt 112.0 lb

## 2014-05-28 DIAGNOSIS — R609 Edema, unspecified: Secondary | ICD-10-CM

## 2014-05-28 DIAGNOSIS — E559 Vitamin D deficiency, unspecified: Secondary | ICD-10-CM | POA: Diagnosis not present

## 2014-05-28 DIAGNOSIS — E785 Hyperlipidemia, unspecified: Secondary | ICD-10-CM | POA: Diagnosis not present

## 2014-05-28 DIAGNOSIS — Z0000 Encounter for general adult medical examination without abnormal findings: Secondary | ICD-10-CM | POA: Diagnosis not present

## 2014-05-28 DIAGNOSIS — R233 Spontaneous ecchymoses: Secondary | ICD-10-CM

## 2014-05-28 LAB — LIPID PANEL
Cholesterol: 178 mg/dL (ref 0–200)
HDL: 81.7 mg/dL (ref >39.00)
LDL Cholesterol: 84 mg/dL (ref 0–99)
NONHDL: 96.3
Total CHOL/HDL Ratio: 2
Triglycerides: 62 mg/dL (ref 0.0–149.0)
VLDL: 12.4 mg/dL (ref 0.0–40.0)

## 2014-05-28 LAB — BASIC METABOLIC PANEL
BUN: 20 mg/dL (ref 6–23)
CALCIUM: 10 mg/dL (ref 8.4–10.5)
CO2: 28 mEq/L (ref 19–32)
Chloride: 109 mEq/L (ref 96–112)
Creatinine, Ser: 0.81 mg/dL (ref 0.40–1.20)
GFR: 73.57 mL/min (ref >60.00)
GLUCOSE: 97 mg/dL (ref 70–99)
Potassium: 4.3 mEq/L (ref 3.5–5.1)
Sodium: 141 mEq/L (ref 135–145)

## 2014-05-28 LAB — CBC WITH DIFFERENTIAL/PLATELET
BASOS ABS: 0 10*3/uL (ref 0.0–0.1)
Basophils Relative: 0.5 % (ref 0.0–3.0)
EOS PCT: 2 % (ref 0.0–5.0)
Eosinophils Absolute: 0.1 10*3/uL (ref 0.0–0.7)
HCT: 43.3 % (ref 36.0–46.0)
HEMOGLOBIN: 14.8 g/dL (ref 12.0–15.0)
LYMPHS PCT: 22 % (ref 12.0–46.0)
Lymphs Abs: 1.4 10*3/uL (ref 0.7–4.0)
MCHC: 34.1 g/dL (ref 30.0–36.0)
MCV: 91.3 fl (ref 78.0–100.0)
MONOS PCT: 7.2 % (ref 3.0–12.0)
Monocytes Absolute: 0.5 10*3/uL (ref 0.1–1.0)
NEUTROS ABS: 4.3 10*3/uL (ref 1.4–7.7)
Neutrophils Relative %: 68.3 % (ref 43.0–77.0)
Platelets: 211 10*3/uL (ref 150.0–400.0)
RBC: 4.74 Mil/uL (ref 3.87–5.11)
RDW: 12.9 % (ref 11.5–15.5)
WBC: 6.4 10*3/uL (ref 4.0–10.5)

## 2014-05-28 LAB — HEPATIC FUNCTION PANEL
ALT: 21 U/L (ref 0–35)
AST: 25 U/L (ref 0–37)
Albumin: 4.5 g/dL (ref 3.5–5.2)
Alkaline Phosphatase: 51 U/L (ref 39–117)
Bilirubin, Direct: 0.2 mg/dL (ref 0.0–0.3)
Total Bilirubin: 1.3 mg/dL — ABNORMAL HIGH (ref 0.2–1.2)
Total Protein: 7.4 g/dL (ref 6.0–8.3)

## 2014-05-28 LAB — VITAMIN D 25 HYDROXY (VIT D DEFICIENCY, FRACTURES): VITD: 47.36 ng/mL (ref 30.00–100.00)

## 2014-05-28 LAB — TSH: TSH: 0.98 u[IU]/mL (ref 0.35–4.50)

## 2014-05-28 NOTE — Assessment & Plan Note (Signed)
BMET 

## 2014-05-28 NOTE — Progress Notes (Signed)
Subjective:    Patient ID: [name] [name], female    DOB: 24-Oct-1940, 74 y.o.   MRN: [id number]  HPI Medicare Wellness Visit: Psychosocial and medical history were reviewed as required by Medicare (history related to abuse, antisocial behavior , firearm risk). Social history: Caffeine: None Alcohol:  One per day Tobacco use: Smoked age [age] up to 2 packs per day Exercise: See below Personal safety/fall risk: No Limitations of activities of daily living: No Seatbelt/ smoke alarm use: He has Healthcare Power of Attorney/Living Will status and End of Life process assessment : Up-to-date Ophthalmologic exam status: Up-to-date; she has wide-angle glaucoma and sees Dr. Bing Plume. Hearing evaluation status: Up-to-date, she sees Dr. Thornell Mule Orientation: Oriented X 3 Memory and recall: Good Spelling or math testing: Excellent Depression/anxiety assessment: No Foreign travel history: [place] November 2015 Immunization status for influenza/pneumonia/ shingles /tetanus: She has not had the pneumonia vaccines and does not take flu vaccine. Risk discussed. Transfusion history: Never Preventive health care maintenance status: Colonoscopy/BMD/mammogram/Pap as per protocol/standard care: Her gynecologist as stated she does not need Pap smears. She was given the option of pelvic exams every 24 months. Mammograms are to be completed her gynecologist. Dental care: Every 6 months Chart reviewed and updated. Active issues reviewed and addressed as documented below.  She has been compliant with her medications without adverse effects. She takes the spironolactone for fluid retention not hypertension. She is on a heart healthy diet. She exercises 5 days a week for 90 minutes as aerobics, weightbearing exercise, and yoga. She has no associated cardiopulmonary symptoms.  She does have a strong family history of coronary disease. Her mother had a myocardial infarction in  her late 29s. Maternal grandfather had a  myocardial infarction in his 25s area he died of thoracic aneurysm. Maternal uncle had heart attack over the age of 77.  Her colonoscopy is up-to-date as noted (2013). She is due in 2023. She has no active GI symptoms.  She does have some petechiae over the forearms. This seems to be related to carrying items. She is not on aspirin or nonsteroidals. She has no other bleeding dyscrasias.    Review of Systems   Chest pain, palpitations, tachycardia, exertional dyspnea, paroxysmal nocturnal dyspnea, claudication or edema are absent.  Unexplained weight loss, abdominal pain, significant dyspepsia, dysphagia, melena, rectal bleeding, or persistently small caliber stools are denied.  Epistaxis, hemoptysis, or hematuria. There is no abnormal bruising , bleeding, or difficulty stopping bleeding with injury.     Objective:   Physical Exam Pertinent or positive findings include: Wax is present in the right ear greater than left.  Hearing is normal to whisper at 6 feet.  She has a grade 6/0-1 systolic murmur at the right base.  Aorta is palpable without aneurysm.  She has crepitus of the knees, greater on the left than the right. There are petechiae over the forearms.  General appearance :Thin but adequately nourished; in no distress. Eyes: No conjunctival inflammation or scleral icterus is present. Oral exam:  Lips and gums are healthy appearing.There is no oropharyngeal erythema or exudate noted. Dental hygiene is good. Heart:  Normal rate and regular rhythm. S1 and S2 normal without gallop, click, rub or other extra sounds   Lungs:Chest clear to auscultation; no wheezes, rhonchi,rales ,or rubs present.No increased work of breathing.  Abdomen: bowel sounds normal, soft and non-tender without masses, organomegaly or hernias noted.  No guarding or rebound.  Vascular : all pulses equal ; no bruits present. Skin:Warm &  dry.  Intact without suspicious lesions or rashes ; no tenting or jaundice    Lymphatic: No lymphadenopathy is noted about the head, neck, axilla Neuro: Strength, tone & DTRs normal.         Assessment & Plan:  See Current Assessment & Plan in Problem List under specific DiagnosisThe labs will be reviewed and risks and options assessed. Written recommendations will be provided by mail or directly through My Chart.Further evaluation or change in medical therapy will be directed by those results.

## 2014-05-28 NOTE — Patient Instructions (Signed)
  Your next office appointment will be determined based upon review of your pending labs  and  xrays  Those instructions will be transmitted to you by My Chart  Critical results will be called.   Followup as needed for any active or acute issue. Please report any significant change in your symptoms.  Colonoscopy due 2023

## 2014-05-28 NOTE — Assessment & Plan Note (Signed)
Vitamin D level 

## 2014-05-28 NOTE — Progress Notes (Signed)
Pre visit review using our clinic review tool, if applicable. No additional management support is needed unless otherwise documented below in the visit note. 

## 2014-05-28 NOTE — Assessment & Plan Note (Signed)
Lipids, LFTs, TSH  

## 2014-06-01 ENCOUNTER — Telehealth: Payer: Self-pay | Admitting: Internal Medicine

## 2014-06-01 NOTE — Telephone Encounter (Signed)
GFR is excellent @ > 70 ( normal is >60)

## 2014-06-01 NOTE — Telephone Encounter (Signed)
Patient is wanting to know if anything can improve GFR---do you have any suggestions with improving GFR possibly with diet, or do you have any other recommendations----please advise, i will call her, thanks

## 2014-06-01 NOTE — Telephone Encounter (Signed)
Patient advised of dr hoppers note

## 2014-06-01 NOTE — Telephone Encounter (Signed)
Pt called in and has a few questions about her lab results  [phone]- best number

## 2014-07-06 ENCOUNTER — Other Ambulatory Visit: Payer: Self-pay

## 2014-07-15 ENCOUNTER — Other Ambulatory Visit: Payer: Self-pay

## 2014-07-15 DIAGNOSIS — Z1231 Encounter for screening mammogram for malignant neoplasm of breast: Secondary | ICD-10-CM

## 2014-07-16 ENCOUNTER — Ambulatory Visit: Payer: Medicare Other

## 2014-07-24 ENCOUNTER — Ambulatory Visit (INDEPENDENT_AMBULATORY_CARE_PROVIDER_SITE_OTHER): Payer: Medicare Other

## 2014-07-24 DIAGNOSIS — Z23 Encounter for immunization: Secondary | ICD-10-CM | POA: Diagnosis not present

## 2014-08-05 DIAGNOSIS — H25813 Combined forms of age-related cataract, bilateral: Secondary | ICD-10-CM | POA: Diagnosis not present

## 2014-08-05 DIAGNOSIS — H4011X2 Primary open-angle glaucoma, moderate stage: Secondary | ICD-10-CM | POA: Diagnosis not present

## 2014-09-03 ENCOUNTER — Other Ambulatory Visit: Payer: Self-pay | Admitting: Internal Medicine

## 2014-09-03 MED ORDER — SIMVASTATIN 20 MG PO TABS: ORAL_TABLET | ORAL | Status: DC

## 2014-09-03 NOTE — Telephone Encounter (Signed)
Left msg on triage stating [facility] has sent request to have her spironolactone refill. She is also needing her simvastatin. Sent both back to [facility]...Johny Chess

## 2014-09-11 ENCOUNTER — Telehealth: Payer: Self-pay | Admitting: Emergency Medicine

## 2014-09-11 NOTE — Telephone Encounter (Signed)
Please order Bone density test. Pt has appt Sept 6th

## 2014-09-14 ENCOUNTER — Other Ambulatory Visit: Payer: Self-pay | Admitting: Internal Medicine

## 2014-09-14 DIAGNOSIS — M8580 Other specified disorders of bone density and structure, unspecified site: Secondary | ICD-10-CM

## 2014-09-14 DIAGNOSIS — Z9189 Other specified personal risk factors, not elsewhere classified: Secondary | ICD-10-CM

## 2014-09-14 DIAGNOSIS — E559 Vitamin D deficiency, unspecified: Secondary | ICD-10-CM

## 2014-09-15 ENCOUNTER — Other Ambulatory Visit: Payer: Self-pay | Admitting: Internal Medicine

## 2014-09-15 DIAGNOSIS — E2839 Other primary ovarian failure: Secondary | ICD-10-CM

## 2014-09-15 NOTE — Telephone Encounter (Signed)
Bone density has changed Dx code for test

## 2014-09-16 ENCOUNTER — Ambulatory Visit (INDEPENDENT_AMBULATORY_CARE_PROVIDER_SITE_OTHER)
Admission: RE | Admit: 2014-09-16 | Discharge: 2014-09-16 | Disposition: A | Discharge status: Home / Self Care | Payer: Medicare Other | Source: Ambulatory Visit | Attending: Internal Medicine | Admitting: Internal Medicine

## 2014-09-16 DIAGNOSIS — E2839 Other primary ovarian failure: Secondary | ICD-10-CM

## 2014-10-01 DIAGNOSIS — H9042 Sensorineural hearing loss, unilateral, left ear, with unrestricted hearing on the contralateral side: Secondary | ICD-10-CM | POA: Diagnosis not present

## 2014-10-12 ENCOUNTER — Ambulatory Visit
Admission: RE | Admit: 2014-10-12 | Discharge: 2014-10-12 | Disposition: A | Discharge status: Home / Self Care | Payer: Medicare Other | Source: Ambulatory Visit

## 2014-10-12 DIAGNOSIS — Z1231 Encounter for screening mammogram for malignant neoplasm of breast: Secondary | ICD-10-CM | POA: Diagnosis not present

## 2014-11-10 DIAGNOSIS — H401132 Primary open-angle glaucoma, bilateral, moderate stage: Secondary | ICD-10-CM | POA: Diagnosis not present

## 2014-11-10 DIAGNOSIS — H25813 Combined forms of age-related cataract, bilateral: Secondary | ICD-10-CM | POA: Diagnosis not present

## 2014-11-12 ENCOUNTER — Encounter: Payer: Self-pay | Admitting: Internal Medicine

## 2014-12-14 DIAGNOSIS — M65341 Trigger finger, right ring finger: Secondary | ICD-10-CM | POA: Diagnosis not present

## 2014-12-14 DIAGNOSIS — M79644 Pain in right finger(s): Secondary | ICD-10-CM | POA: Diagnosis not present

## 2015-02-15 DIAGNOSIS — M1612 Unilateral primary osteoarthritis, left hip: Secondary | ICD-10-CM | POA: Diagnosis not present

## 2015-02-22 DIAGNOSIS — H6063 Unspecified chronic otitis externa, bilateral: Secondary | ICD-10-CM | POA: Diagnosis not present

## 2015-02-22 DIAGNOSIS — H6123 Impacted cerumen, bilateral: Secondary | ICD-10-CM | POA: Diagnosis not present

## 2015-02-22 DIAGNOSIS — H9042 Sensorineural hearing loss, unilateral, left ear, with unrestricted hearing on the contralateral side: Secondary | ICD-10-CM | POA: Diagnosis not present

## 2015-03-10 HISTORY — PX: MOUTH SURGERY: SHX715

## 2015-04-06 DIAGNOSIS — H401132 Primary open-angle glaucoma, bilateral, moderate stage: Secondary | ICD-10-CM | POA: Diagnosis not present

## 2015-04-06 DIAGNOSIS — H25813 Combined forms of age-related cataract, bilateral: Secondary | ICD-10-CM | POA: Diagnosis not present

## 2015-05-26 ENCOUNTER — Other Ambulatory Visit: Payer: Self-pay | Admitting: Internal Medicine

## 2015-05-26 NOTE — Telephone Encounter (Signed)
Pt has schedule new appt w/Dr. Etter Sjogren 06/04/15...[name] [name]

## 2015-05-31 ENCOUNTER — Encounter: Payer: Medicare Other | Admitting: Internal Medicine

## 2015-06-03 ENCOUNTER — Telehealth: Payer: Self-pay | Admitting: *Deleted

## 2015-06-03 ENCOUNTER — Encounter: Payer: Self-pay | Admitting: *Deleted

## 2015-06-03 NOTE — Telephone Encounter (Signed)
Pre-Visit Call completed with patient and chart updated.   Pre-Visit Info documented in Specialty Comments under [employer].    

## 2015-06-03 NOTE — Telephone Encounter (Signed)
Unable to reach patient at time of pre-visit call. Left message for patient to return call when available.  

## 2015-06-04 ENCOUNTER — Encounter: Payer: Self-pay | Admitting: Family Medicine

## 2015-06-04 ENCOUNTER — Ambulatory Visit (INDEPENDENT_AMBULATORY_CARE_PROVIDER_SITE_OTHER): Payer: Medicare Other | Admitting: Family Medicine

## 2015-06-04 VITALS — BP 120/68 | HR 75 | Temp 99.0°F | Ht 59.75 in | Wt 102.0 lb

## 2015-06-04 DIAGNOSIS — E785 Hyperlipidemia, unspecified: Secondary | ICD-10-CM | POA: Diagnosis not present

## 2015-06-04 DIAGNOSIS — E2839 Other primary ovarian failure: Secondary | ICD-10-CM

## 2015-06-04 DIAGNOSIS — Z0000 Encounter for general adult medical examination without abnormal findings: Secondary | ICD-10-CM

## 2015-06-04 DIAGNOSIS — M8580 Other specified disorders of bone density and structure, unspecified site: Secondary | ICD-10-CM

## 2015-06-04 DIAGNOSIS — R29890 Loss of height: Secondary | ICD-10-CM | POA: Diagnosis not present

## 2015-06-04 LAB — COMPREHENSIVE METABOLIC PANEL
ALT: 23 U/L (ref 0–35)
AST: 25 U/L (ref 0–37)
Albumin: 4.6 g/dL (ref 3.5–5.2)
Alkaline Phosphatase: 59 U/L (ref 39–117)
BUN: 19 mg/dL (ref 6–23)
CALCIUM: 10.2 mg/dL (ref 8.4–10.5)
CHLORIDE: 108 meq/L (ref 96–112)
CO2: 28 meq/L (ref 19–32)
Creatinine, Ser: 0.83 mg/dL (ref 0.40–1.20)
GFR: 71.33 mL/min (ref >60.00)
Glucose, Bld: 96 mg/dL (ref 70–99)
Potassium: 4.6 mEq/L (ref 3.5–5.1)
Sodium: 142 mEq/L (ref 135–145)
Total Bilirubin: 1 mg/dL (ref 0.2–1.2)
Total Protein: 7.4 g/dL (ref 6.0–8.3)

## 2015-06-04 LAB — CBC WITH DIFFERENTIAL/PLATELET
BASOS ABS: 0 10*3/uL (ref 0.0–0.1)
Basophils Relative: 0.5 % (ref 0.0–3.0)
EOS ABS: 0.2 10*3/uL (ref 0.0–0.7)
Eosinophils Relative: 3.1 % (ref 0.0–5.0)
HCT: 44.6 % (ref 36.0–46.0)
Hemoglobin: 14.6 g/dL (ref 12.0–15.0)
LYMPHS ABS: 1.5 10*3/uL (ref 0.7–4.0)
Lymphocytes Relative: 23.3 % (ref 12.0–46.0)
MCHC: 32.8 g/dL (ref 30.0–36.0)
MCV: 94.9 fl (ref 78.0–100.0)
Monocytes Absolute: 0.5 10*3/uL (ref 0.1–1.0)
Monocytes Relative: 8 % (ref 3.0–12.0)
NEUTROS ABS: 4.2 10*3/uL (ref 1.4–7.7)
NEUTROS PCT: 65.1 % (ref 43.0–77.0)
PLATELETS: 240 10*3/uL (ref 150.0–400.0)
RBC: 4.7 Mil/uL (ref 3.87–5.11)
RDW: 13.1 % (ref 11.5–15.5)
WBC: 6.4 10*3/uL (ref 4.0–10.5)

## 2015-06-04 LAB — LIPID PANEL
CHOL/HDL RATIO: 2
Cholesterol: 202 mg/dL — ABNORMAL HIGH (ref 0–200)
HDL: 82.9 mg/dL (ref >39.00)
LDL Cholesterol: 105 mg/dL — ABNORMAL HIGH (ref 0–99)
NonHDL: 118.75
TRIGLYCERIDES: 69 mg/dL (ref 0.0–149.0)
VLDL: 13.8 mg/dL (ref 0.0–40.0)

## 2015-06-04 NOTE — Patient Instructions (Signed)
Preventive Care for Adults, Female A healthy lifestyle and preventive care can promote health and wellness. Preventive health guidelines for women include the following key practices.  A routine yearly physical is a good way to check with your health care provider about your health and preventive screening. It is a chance to share any concerns and updates on your health and to receive a thorough exam.  Visit your dentist for a routine exam and preventive care every 6 months. Brush your teeth twice a day and floss once a day. Good oral hygiene prevents tooth decay and gum disease.  The frequency of eye exams is based on your age, health, family medical history, use of contact lenses, and other factors. Follow your health care provider's recommendations for frequency of eye exams.  Eat a healthy diet. Foods like vegetables, fruits, whole grains, low-fat dairy products, and lean protein foods contain the nutrients you need without too many calories. Decrease your intake of foods high in solid fats, added sugars, and salt. Eat the right amount of calories for you.Get information about a proper diet from your health care provider, if necessary.  Regular physical exercise is one of the most important things you can do for your health. Most adults should get at least 150 minutes of moderate-intensity exercise (any activity that increases your heart rate and causes you to sweat) each week. In addition, most adults need muscle-strengthening exercises on 2 or more days a week.  Maintain a healthy weight. The body mass index (BMI) is a screening tool to identify possible weight problems. It provides an estimate of body fat based on height and weight. Your health care provider can find your BMI and can help you achieve or maintain a healthy weight.For adults 20 years and older:  A BMI below 18.5 is considered underweight.  A BMI of 18.5 to 24.9 is normal.  A BMI of 25 to 29.9 is considered overweight.  A  BMI of 30 and above is considered obese.  Maintain normal blood lipids and cholesterol levels by exercising and minimizing your intake of saturated fat. Eat a balanced diet with plenty of fruit and vegetables. Blood tests for lipids and cholesterol should begin at age 45 and be repeated every 5 years. If your lipid or cholesterol levels are high, you are over 50, or you are at high risk for heart disease, you may need your cholesterol levels checked more frequently.Ongoing high lipid and cholesterol levels should be treated with medicines if diet and exercise are not working.  If you smoke, find out from your health care provider how to quit. If you do not use tobacco, do not start.  Lung cancer screening is recommended for adults aged 45-80 years who are at high risk for developing lung cancer because of a history of smoking. A yearly low-dose CT scan of the lungs is recommended for people who have at least a 30-pack-year history of smoking and are a current smoker or have quit within the past 15 years. A pack year of smoking is smoking an average of 1 pack of cigarettes a day for 1 year (for example: 1 pack a day for 30 years or 2 packs a day for 15 years). Yearly screening should continue until the smoker has stopped smoking for at least 15 years. Yearly screening should be stopped for people who develop a health problem that would prevent them from having lung cancer treatment.  If you are pregnant, do not drink alcohol. If you are  breastfeeding, be very cautious about drinking alcohol. If you are not pregnant and choose to drink alcohol, do not have more than 1 drink per day. One drink is considered to be 12 ounces (355 mL) of beer, 5 ounces (148 mL) of wine, or 1.5 ounces (44 mL) of liquor.  Avoid use of street drugs. Do not share needles with anyone. Ask for help if you need support or instructions about stopping the use of drugs.  High blood pressure causes heart disease and increases the risk  of stroke. Your blood pressure should be checked at least every 1 to 2 years. Ongoing high blood pressure should be treated with medicines if weight loss and exercise do not work.  If you are 55-79 years old, ask your health care provider if you should take aspirin to prevent strokes.  Diabetes screening is done by taking a blood sample to check your blood glucose level after you have not eaten for a certain period of time (fasting). If you are not overweight and you do not have risk factors for diabetes, you should be screened once every 3 years starting at age 45. If you are overweight or obese and you are 40-70 years of age, you should be screened for diabetes every year as part of your cardiovascular risk assessment.  Breast cancer screening is essential preventive care for women. You should practice "breast self-awareness." This means understanding the normal appearance and feel of your breasts and may include breast self-examination. Any changes detected, no matter how small, should be reported to a health care provider. Women in their 20s and 30s should have a clinical breast exam (CBE) by a health care provider as part of a regular health exam every 1 to 3 years. After age 40, women should have a CBE every year. Starting at age 40, women should consider having a mammogram (breast X-ray test) every year. Women who have a family history of breast cancer should talk to their health care provider about genetic screening. Women at a high risk of breast cancer should talk to their health care providers about having an MRI and a mammogram every year.  Breast cancer gene (BRCA)-related cancer risk assessment is recommended for women who have family members with BRCA-related cancers. BRCA-related cancers include breast, ovarian, tubal, and peritoneal cancers. Having family members with these cancers may be associated with an increased risk for harmful changes (mutations) in the breast cancer genes BRCA1 and  BRCA2. Results of the assessment will determine the need for genetic counseling and BRCA1 and BRCA2 testing.  Your health care provider may recommend that you be screened regularly for cancer of the pelvic organs (ovaries, uterus, and vagina). This screening involves a pelvic examination, including checking for microscopic changes to the surface of your cervix (Pap test). You may be encouraged to have this screening done every 3 years, beginning at age 21.  For women ages 30-65, health care providers may recommend pelvic exams and Pap testing every 3 years, or they may recommend the Pap and pelvic exam, combined with testing for human papilloma virus (HPV), every 5 years. Some types of HPV increase your risk of cervical cancer. Testing for HPV may also be done on women of any age with unclear Pap test results.  Other health care providers may not recommend any screening for nonpregnant women who are considered low risk for pelvic cancer and who do not have symptoms. Ask your health care provider if a screening pelvic exam is right for   you.  If you have had past treatment for cervical cancer or a condition that could lead to cancer, you need Pap tests and screening for cancer for at least 20 years after your treatment. If Pap tests have been discontinued, your risk factors (such as having a new sexual partner) need to be reassessed to determine if screening should resume. Some women have medical problems that increase the chance of getting cervical cancer. In these cases, your health care provider may recommend more frequent screening and Pap tests.  Colorectal cancer can be detected and often prevented. Most routine colorectal cancer screening begins at the age of 50 years and continues through age 75 years. However, your health care provider may recommend screening at an earlier age if you have risk factors for colon cancer. On a yearly basis, your health care provider may provide home test kits to check  for hidden blood in the stool. Use of a small camera at the end of a tube, to directly examine the colon (sigmoidoscopy or colonoscopy), can detect the earliest forms of colorectal cancer. Talk to your health care provider about this at age 50, when routine screening begins. Direct exam of the colon should be repeated every 5-10 years through age 75 years, unless early forms of precancerous polyps or small growths are found.  People who are at an increased risk for hepatitis B should be screened for this virus. You are considered at high risk for hepatitis B if:  You were born in a country where hepatitis B occurs often. Talk with your health care provider about which countries are considered high risk.  Your parents were born in a high-risk country and you have not received a shot to protect against hepatitis B (hepatitis B vaccine).  You have HIV or AIDS.  You use needles to inject street drugs.  You live with, or have sex with, someone who has hepatitis B.  You get hemodialysis treatment.  You take certain medicines for conditions like cancer, organ transplantation, and autoimmune conditions.  Hepatitis C blood testing is recommended for all people born from 1945 through 1965 and any individual with known risks for hepatitis C.  Practice safe sex. Use condoms and avoid high-risk sexual practices to reduce the spread of sexually transmitted infections (STIs). STIs include gonorrhea, chlamydia, syphilis, trichomonas, herpes, HPV, and human immunodeficiency virus (HIV). Herpes, HIV, and HPV are viral illnesses that have no cure. They can result in disability, cancer, and death.  You should be screened for sexually transmitted illnesses (STIs) including gonorrhea and chlamydia if:  You are sexually active and are younger than 24 years.  You are older than 24 years and your health care provider tells you that you are at risk for this type of infection.  Your sexual activity has changed  since you were last screened and you are at an increased risk for chlamydia or gonorrhea. Ask your health care provider if you are at risk.  If you are at risk of being infected with HIV, it is recommended that you take a prescription medicine daily to prevent HIV infection. This is called preexposure prophylaxis (PrEP). You are considered at risk if:  You are sexually active and do not regularly use condoms or know the HIV status of your partner(s).  You take drugs by injection.  You are sexually active with a partner who has HIV.  Talk with your health care provider about whether you are at high risk of being infected with HIV. If   you choose to begin PrEP, you should first be tested for HIV. You should then be tested every 3 months for as long as you are taking PrEP.  Osteoporosis is a disease in which the bones lose minerals and strength with aging. This can result in serious bone fractures or breaks. The risk of osteoporosis can be identified using a bone density scan. Women ages 67 years and over and women at risk for fractures or osteoporosis should discuss screening with their health care providers. Ask your health care provider whether you should take a calcium supplement or vitamin D to reduce the rate of osteoporosis.  Menopause can be associated with physical symptoms and risks. Hormone replacement therapy is available to decrease symptoms and risks. You should talk to your health care provider about whether hormone replacement therapy is right for you.  Use sunscreen. Apply sunscreen liberally and repeatedly throughout the day. You should seek shade when your shadow is shorter than you. Protect yourself by wearing long sleeves, pants, a wide-brimmed hat, and sunglasses year round, whenever you are outdoors.  Once a month, do a whole body skin exam, using a mirror to look at the skin on your back. Tell your health care provider of new moles, moles that have irregular borders, moles that  are larger than a pencil eraser, or moles that have changed in shape or color.  Stay current with required vaccines (immunizations).  Influenza vaccine. All adults should be immunized every year.  Tetanus, diphtheria, and acellular pertussis (Td, Tdap) vaccine. Pregnant women should receive 1 dose of Tdap vaccine during each pregnancy. The dose should be obtained regardless of the length of time since the last dose. Immunization is preferred during the 27th-36th week of gestation. An adult who has not previously received Tdap or who does not know her vaccine status should receive 1 dose of Tdap. This initial dose should be followed by tetanus and diphtheria toxoids (Td) booster doses every 10 years. Adults with an unknown or incomplete history of completing a 3-dose immunization series with Td-containing vaccines should begin or complete a primary immunization series including a Tdap dose. Adults should receive a Td booster every 10 years.  Varicella vaccine. An adult without evidence of immunity to varicella should receive 2 doses or a second dose if she has previously received 1 dose. Pregnant females who do not have evidence of immunity should receive the first dose after pregnancy. This first dose should be obtained before leaving the health care facility. The second dose should be obtained 4-8 weeks after the first dose.  Human papillomavirus (HPV) vaccine. Females aged 13-26 years who have not received the vaccine previously should obtain the 3-dose series. The vaccine is not recommended for use in pregnant females. However, pregnancy testing is not needed before receiving a dose. If a female is found to be pregnant after receiving a dose, no treatment is needed. In that case, the remaining doses should be delayed until after the pregnancy. Immunization is recommended for any person with an immunocompromised condition through the age of 61 years if she did not get any or all doses earlier. During the  3-dose series, the second dose should be obtained 4-8 weeks after the first dose. The third dose should be obtained 24 weeks after the first dose and 16 weeks after the second dose.  Zoster vaccine. One dose is recommended for adults aged 30 years or older unless certain conditions are present.  Measles, mumps, and rubella (MMR) vaccine. Adults born  before 1957 generally are considered immune to measles and mumps. Adults born in 1957 or later should have 1 or more doses of MMR vaccine unless there is a contraindication to the vaccine or there is laboratory evidence of immunity to each of the three diseases. A routine second dose of MMR vaccine should be obtained at least 28 days after the first dose for students attending postsecondary schools, health care workers, or international travelers. People who received inactivated measles vaccine or an unknown type of measles vaccine during 1963-1967 should receive 2 doses of MMR vaccine. People who received inactivated mumps vaccine or an unknown type of mumps vaccine before 1979 and are at high risk for mumps infection should consider immunization with 2 doses of MMR vaccine. For females of childbearing age, rubella immunity should be determined. If there is no evidence of immunity, females who are not pregnant should be vaccinated. If there is no evidence of immunity, females who are pregnant should delay immunization until after pregnancy. Unvaccinated health care workers born before 1957 who lack laboratory evidence of measles, mumps, or rubella immunity or laboratory confirmation of disease should consider measles and mumps immunization with 2 doses of MMR vaccine or rubella immunization with 1 dose of MMR vaccine.  Pneumococcal 13-valent conjugate (PCV13) vaccine. When indicated, a person who is uncertain of his immunization history and has no record of immunization should receive the PCV13 vaccine. All adults 65 years of age and older should receive this  vaccine. An adult aged 19 years or older who has certain medical conditions and has not been previously immunized should receive 1 dose of PCV13 vaccine. This PCV13 should be followed with a dose of pneumococcal polysaccharide (PPSV23) vaccine. Adults who are at high risk for pneumococcal disease should obtain the PPSV23 vaccine at least 8 weeks after the dose of PCV13 vaccine. Adults older than 75 years of age who have normal immune system function should obtain the PPSV23 vaccine dose at least 1 year after the dose of PCV13 vaccine.  Pneumococcal polysaccharide (PPSV23) vaccine. When PCV13 is also indicated, PCV13 should be obtained first. All adults aged 65 years and older should be immunized. An adult younger than age 65 years who has certain medical conditions should be immunized. Any person who resides in a nursing home or long-term care facility should be immunized. An adult smoker should be immunized. People with an immunocompromised condition and certain other conditions should receive both PCV13 and PPSV23 vaccines. People with human immunodeficiency virus (HIV) infection should be immunized as soon as possible after diagnosis. Immunization during chemotherapy or radiation therapy should be avoided. Routine use of PPSV23 vaccine is not recommended for American Indians, [place] Natives, or people younger than 65 years unless there are medical conditions that require PPSV23 vaccine. When indicated, people who have unknown immunization and have no record of immunization should receive PPSV23 vaccine. One-time revaccination 5 years after the first dose of PPSV23 is recommended for people aged 19-64 years who have chronic kidney failure, nephrotic syndrome, asplenia, or immunocompromised conditions. People who received 1-2 doses of PPSV23 before age 65 years should receive another dose of PPSV23 vaccine at age 65 years or later if at least 5 years have passed since the previous dose. Doses of PPSV23 are not  needed for people immunized with PPSV23 at or after age 65 years.  Meningococcal vaccine. Adults with asplenia or persistent complement component deficiencies should receive 2 doses of quadrivalent meningococcal conjugate (MenACWY-D) vaccine. The doses should be obtained   at least 2 months apart. Microbiologists working with certain meningococcal bacteria, [place] recruits, people at risk during an outbreak, and people who travel to or live in countries with a high rate of meningitis should be immunized. A first-year college student up through age 34 years who is living in a residence hall should receive a dose if she did not receive a dose on or after her 16th birthday. Adults who have certain high-risk conditions should receive one or more doses of vaccine.  Hepatitis A vaccine. Adults who wish to be protected from this disease, have certain high-risk conditions, work with hepatitis A-infected animals, work in hepatitis A research labs, or travel to or work in countries with a high rate of hepatitis A should be immunized. Adults who were previously unvaccinated and who anticipate close contact with an international adoptee during the first 60 days after arrival in the [place] from a country with a high rate of hepatitis A should be immunized.  Hepatitis B vaccine. Adults who wish to be protected from this disease, have certain high-risk conditions, may be exposed to blood or other infectious body fluids, are household contacts or sex partners of hepatitis B positive people, are clients or workers in certain care facilities, or travel to or work in countries with a high rate of hepatitis B should be immunized.  Haemophilus influenzae type b (Hib) vaccine. A previously unvaccinated person with asplenia or sickle cell disease or having a scheduled splenectomy should receive 1 dose of Hib vaccine. Regardless of previous immunization, a recipient of a hematopoietic stem cell transplant should receive a  3-dose series 6-12 months after her successful transplant. Hib vaccine is not recommended for adults with HIV infection. Preventive Services / Frequency Ages 35 to 4 years  Blood pressure check.** / Every 3-5 years.  Lipid and cholesterol check.** / Every 5 years beginning at age 60.  Clinical breast exam.** / Every 3 years for women in their 71s and 10s.  BRCA-related cancer risk assessment.** / For women who have family members with a BRCA-related cancer (breast, ovarian, tubal, or peritoneal cancers).  Pap test.** / Every 2 years from ages 76 through 26. Every 3 years starting at age 61 through age 76 or 93 with a history of 3 consecutive normal Pap tests.  HPV screening.** / Every 3 years from ages 37 through ages 60 to 51 with a history of 3 consecutive normal Pap tests.  Hepatitis C blood test.** / For any individual with known risks for hepatitis C.  Skin self-exam. / Monthly.  Influenza vaccine. / Every year.  Tetanus, diphtheria, and acellular pertussis (Tdap, Td) vaccine.** / Consult your health care provider. Pregnant women should receive 1 dose of Tdap vaccine during each pregnancy. 1 dose of Td every 10 years.  Varicella vaccine.** / Consult your health care provider. Pregnant females who do not have evidence of immunity should receive the first dose after pregnancy.  HPV vaccine. / 3 doses over 6 months, if 93 and younger. The vaccine is not recommended for use in pregnant females. However, pregnancy testing is not needed before receiving a dose.  Measles, mumps, rubella (MMR) vaccine.** / You need at least 1 dose of MMR if you were born in 1957 or later. You may also need a 2nd dose. For females of childbearing age, rubella immunity should be determined. If there is no evidence of immunity, females who are not pregnant should be vaccinated. If there is no evidence of immunity, females who are  pregnant should delay immunization until after pregnancy.  Pneumococcal  13-valent conjugate (PCV13) vaccine.** / Consult your health care provider.  Pneumococcal polysaccharide (PPSV23) vaccine.** / 1 to 2 doses if you smoke cigarettes or if you have certain conditions.  Meningococcal vaccine.** / 1 dose if you are age 68 to 8 years and a Market researcher living in a residence hall, or have one of several medical conditions, you need to get vaccinated against meningococcal disease. You may also need additional booster doses.  Hepatitis A vaccine.** / Consult your health care provider.  Hepatitis B vaccine.** / Consult your health care provider.  Haemophilus influenzae type b (Hib) vaccine.** / Consult your health care provider. Ages 7 to 53 years  Blood pressure check.** / Every year.  Lipid and cholesterol check.** / Every 5 years beginning at age 25 years.  Lung cancer screening. / Every year if you are aged 11-80 years and have a 30-pack-year history of smoking and currently smoke or have quit within the past 15 years. Yearly screening is stopped once you have quit smoking for at least 15 years or develop a health problem that would prevent you from having lung cancer treatment.  Clinical breast exam.** / Every year after age 48 years.  BRCA-related cancer risk assessment.** / For women who have family members with a BRCA-related cancer (breast, ovarian, tubal, or peritoneal cancers).  Mammogram.** / Every year beginning at age 41 years and continuing for as long as you are in good health. Consult with your health care provider.  Pap test.** / Every 3 years starting at age 65 years through age 37 or 70 years with a history of 3 consecutive normal Pap tests.  HPV screening.** / Every 3 years from ages 72 years through ages 60 to 40 years with a history of 3 consecutive normal Pap tests.  Fecal occult blood test (FOBT) of stool. / Every year beginning at age 21 years and continuing until age 5 years. You may not need to do this test if you get  a colonoscopy every 10 years.  Flexible sigmoidoscopy or colonoscopy.** / Every 5 years for a flexible sigmoidoscopy or every 10 years for a colonoscopy beginning at age 35 years and continuing until age 48 years.  Hepatitis C blood test.** / For all people born from [birth year] through 1965 and any individual with known risks for hepatitis C.  Skin self-exam. / Monthly.  Influenza vaccine. / Every year.  Tetanus, diphtheria, and acellular pertussis (Tdap/Td) vaccine.** / Consult your health care provider. Pregnant women should receive 1 dose of Tdap vaccine during each pregnancy. 1 dose of Td every 10 years.  Varicella vaccine.** / Consult your health care provider. Pregnant females who do not have evidence of immunity should receive the first dose after pregnancy.  Zoster vaccine.** / 1 dose for adults aged 30 years or older.  Measles, mumps, rubella (MMR) vaccine.** / You need at least 1 dose of MMR if you were born in 1957 or later. You may also need a second dose. For females of childbearing age, rubella immunity should be determined. If there is no evidence of immunity, females who are not pregnant should be vaccinated. If there is no evidence of immunity, females who are pregnant should delay immunization until after pregnancy.  Pneumococcal 13-valent conjugate (PCV13) vaccine.** / Consult your health care provider.  Pneumococcal polysaccharide (PPSV23) vaccine.** / 1 to 2 doses if you smoke cigarettes or if you have certain conditions.  Meningococcal vaccine.** /  Consult your health care provider.  Hepatitis A vaccine.** / Consult your health care provider.  Hepatitis B vaccine.** / Consult your health care provider.  Haemophilus influenzae type b (Hib) vaccine.** / Consult your health care provider. Ages 64 years and over  Blood pressure check.** / Every year.  Lipid and cholesterol check.** / Every 5 years beginning at age 23 years.  Lung cancer screening. / Every year if you  are aged 16-80 years and have a 30-pack-year history of smoking and currently smoke or have quit within the past 15 years. Yearly screening is stopped once you have quit smoking for at least 15 years or develop a health problem that would prevent you from having lung cancer treatment.  Clinical breast exam.** / Every year after age 74 years.  BRCA-related cancer risk assessment.** / For women who have family members with a BRCA-related cancer (breast, ovarian, tubal, or peritoneal cancers).  Mammogram.** / Every year beginning at age 44 years and continuing for as long as you are in good health. Consult with your health care provider.  Pap test.** / Every 3 years starting at age 58 years through age 22 or 39 years with 3 consecutive normal Pap tests. Testing can be stopped between 65 and 70 years with 3 consecutive normal Pap tests and no abnormal Pap or HPV tests in the past 10 years.  HPV screening.** / Every 3 years from ages 64 years through ages 70 or 61 years with a history of 3 consecutive normal Pap tests. Testing can be stopped between 65 and 70 years with 3 consecutive normal Pap tests and no abnormal Pap or HPV tests in the past 10 years.  Fecal occult blood test (FOBT) of stool. / Every year beginning at age 40 years and continuing until age 27 years. You may not need to do this test if you get a colonoscopy every 10 years.  Flexible sigmoidoscopy or colonoscopy.** / Every 5 years for a flexible sigmoidoscopy or every 10 years for a colonoscopy beginning at age 7 years and continuing until age 32 years.  Hepatitis C blood test.** / For all people born from [birth year] through 1965 and any individual with known risks for hepatitis C.  Osteoporosis screening.** / A one-time screening for women ages 30 years and over and women at risk for fractures or osteoporosis.  Skin self-exam. / Monthly.  Influenza vaccine. / Every year.  Tetanus, diphtheria, and acellular pertussis (Tdap/Td)  vaccine.** / 1 dose of Td every 10 years.  Varicella vaccine.** / Consult your health care provider.  Zoster vaccine.** / 1 dose for adults aged 35 years or older.  Pneumococcal 13-valent conjugate (PCV13) vaccine.** / Consult your health care provider.  Pneumococcal polysaccharide (PPSV23) vaccine.** / 1 dose for all adults aged 46 years and older.  Meningococcal vaccine.** / Consult your health care provider.  Hepatitis A vaccine.** / Consult your health care provider.  Hepatitis B vaccine.** / Consult your health care provider.  Haemophilus influenzae type b (Hib) vaccine.** / Consult your health care provider. ** Family history and personal history of risk and conditions may change your health care provider's recommendations.   This information is not intended to replace advice given to you by your health care provider. Make sure you discuss any questions you have with your health care provider.   Document Released: 02/21/2001 Document Revised: 01/16/2014 Document Reviewed: 05/23/2010 Elsevier Interactive Patient Education [employer].

## 2015-06-04 NOTE — Progress Notes (Signed)
Pre visit review using our clinic review tool, if applicable. No additional management support is needed unless otherwise documented below in the visit note. 

## 2015-06-04 NOTE — Progress Notes (Signed)
Subjective:   [name] [name] is a 75 y.o. female who presents for Medicare Annual (Subsequent) preventive examination.  Review of Systems:   Review of Systems  Constitutional: Negative for activity change, appetite change and fatigue.  HENT: Negative for hearing loss, congestion, tinnitus and ear discharge.   Eyes: Negative for visual disturbance (see optho q1y -- vision corrected to 20/20 with glasses).  Respiratory: Negative for cough, chest tightness and shortness of breath.   Cardiovascular: Negative for chest pain, palpitations and leg swelling.  Gastrointestinal: Negative for abdominal pain, diarrhea, constipation and abdominal distention.  Genitourinary: Negative for urgency, frequency, decreased urine volume and difficulty urinating.  Musculoskeletal: Negative for back pain, arthralgias and gait problem.  Skin: Negative for color change, pallor and rash.  Neurological: Negative for dizziness, light-headedness, numbness and headaches.  Hematological: Negative for adenopathy. Does not bruise/bleed easily.  Psychiatric/Behavioral: Negative for suicidal ideas, confusion, sleep disturbance, self-injury, dysphoric mood, decreased concentration and agitation.  Pt is able to read and write and can do all ADLs No risk for falling No abuse/ violence in home           Objective:     Vitals: BP 120/68 mmHg  Pulse 75  Temp(Src) 99 F (37.2 C) (Oral)  Ht 4' 11.75" (1.518 m)  Wt 102 lb (46.267 kg)  BMI 20.08 kg/m2  SpO2 98%  Body mass index is 20.08 kg/(m^2). BP 120/68 mmHg  Pulse 75  Temp(Src) 99 F (37.2 C) (Oral)  Ht 4' 11.75" (1.518 m)  Wt 102 lb (46.267 kg)  BMI 20.08 kg/m2  SpO2 98% General appearance: alert, cooperative, appears stated age and no distress Head: Normocephalic, without obvious abnormality, atraumatic Eyes: conjunctivae/corneas clear. PERRL, EOM's intact. Fundi benign. Ears: normal TM's and external ear canals both ears Nose: Nares normal. Septum  midline. Mucosa normal. No drainage or sinus tenderness. Throat: lips, mucosa, and tongue normal; teeth and gums normal Neck: no adenopathy, no carotid bruit, no JVD, supple, symmetrical, trachea midline and thyroid not enlarged, symmetric, no tenderness/mass/nodules Back: symmetric, no curvature. ROM normal. No CVA tenderness. Lungs: clear to auscultation bilaterally Breasts: normal appearance, no masses or tenderness Heart: S1, S2 normal Abdomen: soft, non-tender; bowel sounds normal; no masses,  no organomegaly Pelvic: not indicated; post-menopausal, no abnormal Pap smears in past Extremities: extremities normal, atraumatic, no cyanosis or edema Pulses: 2+ and symmetric Skin: Skin color, texture, turgor normal. No rashes or lesions Lymph nodes: Cervical, supraclavicular, and axillary nodes normal. Neurologic: Alert and oriented X 3, normal strength and tone. Normal symmetric reflexes. Normal coordination and gait   Tobacco History  Smoking status  . Former Smoker  . Quit date: 01/09/1982  Smokeless tobacco  . Never Used    Comment: smoked 1958-1984, up to 1 ppd     Counseling given: Not Answered   Past Medical History  Diagnosis Date  . 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 Other and unspecified hyperlipidemia   . Osteopenia   . Vitamin D deficiency   . Fibrocystic breast disease     resolved off caffeine & nicotine  . Glaucoma     Dr Hillis Range  . Allergy     hayf ever  . Arthritis     osteoarthritis  . Cataract   . Heart murmur     2013 Frequent PAC's   Past Surgical History  Procedure Laterality Date  . Appendectomy    . Breast biopsy  X 2   . Cholecystectomy    . Tubal ligation    . Tonsillectomy    . Refractive surgery  12/2010    for Glaucoma, Dr Bing Plume  . Cesarean section  1971  . Dilation and curettage of uterus  1972    twice  . Nasal septum surgery  1965  . Myringotomy  1946    bilateral  . Colonoscopy  1245,8099, 2013     all Negative , Dr.Brodie. Due 2023  . Mouth surgery  03/2015   Family History  Problem Relation Age of Onset  . Colon cancer Father 59  . Parkinsonism Father   . Prostate cancer Father   . Glaucoma Father   . Osteopenia Mother   . Heart attack Mother 28    non smoker  . Peripheral vascular disease Mother     Status post carotid endarterectomy  . Thyroid disease Sister     X 2 sisters  . Hypertension Sister     X 2 sisters  . Osteopenia Sister   . Hyperlipidemia Sister   . Arthritis Brother     OA  . Gout Brother   . Hyperlipidemia Brother   . Hypertension Brother   . Diabetes Paternal Aunt   . Hypertension Maternal Grandfather   . Heart attack Maternal Grandfather 43  . Aortic aneurysm Maternal Grandfather   . Heart attack Maternal Uncle     >55  . Diabetes Paternal Grandmother   . Cancer Paternal Grandmother     esophageal  . Pancreatic cancer Paternal Grandmother   . Coronary artery disease Maternal Grandmother   . Hypertension Maternal Grandmother   . Osteopenia Maternal Grandmother   . Lung disease Sister     fungal infection acquired  in [place]  . Osteoarthritis Sister    History  Sexual Activity  . Sexual Activity: Not on file    Outpatient Encounter Prescriptions as of 06/04/2015  Medication Sig  . EPINEPHrine (EPI-PEN) 0.3 mg/0.3 mL DEVI Inject 0.3 mLs (0.3 mg total) into the muscle once.  . Glucosamine-Vitamin D (GLUCOSAMINE PLUS VITAMIN D) 1500-200 MG-UNIT TABS Take 4 capsules by mouth daily.  . Multiple Minerals-Vitamins (CALCIUM & VIT D3 BONE HEALTH PO) Take 1 tablet by mouth daily.  . NON FORMULARY Take by mouth daily. Liver Care PO  . Omega-3 Fatty Acids (OMEGA 3 PO) Take by mouth. EPA 800 DHA 124 mg once daily  . Probiotic Product (PROBIOTIC DAILY PO) Take by mouth.  . simvastatin (ZOCOR) 20 MG tablet Take 1 tablet by mouth at  bedtime  . spironolactone (ALDACTONE) 25 MG tablet Take 1 tablet by mouth  daily  . timolol (BETIMOL) 0.5 % ophthalmic  solution Place 1 drop into both eyes daily.  . travoprost, benzalkonium, (TRAVATAN) 0.004 % ophthalmic solution Place 1 drop into both eyes daily.  . TURMERIC CURCUMIN PO Take 900 mg by mouth daily.  Marland Kitchen Ubiquinol 100 MG CAPS Take 1 capsule by mouth daily.  . [DISCONTINUED] CALCIUM PO Take 760 mg by mouth daily.  . [DISCONTINUED] Cholecalciferol (VITAMIN D3) 1000 units CAPS Take 1 capsule by mouth daily.  . [DISCONTINUED] Glucosamine-Chondroitin (GLUCOSAMINE CHONDR COMPLEX PO) Take 1,200-1,500 mg by mouth daily.   . [DISCONTINUED] Omega-3 Fatty Acids (FISH OIL PO) Take 1 capsule by mouth daily.   No facility-administered encounter medications on file as of 06/04/2015.    Activities of Daily Living In your present state of health, do you have any difficulty performing the following activities: 06/04/2015  Hearing?  N  Vision? N  Difficulty concentrating or making decisions? N  Walking or climbing stairs? N  Dressing or bathing? N  Doing errands, shopping? N    Patient Care Team: Ann Held, DO as PCP - General (Family Medicine) [name], MD as Consulting Physician (Orthopedic Surgery) Calvert Cantor, MD as Consulting Physician (Ophthalmology) Vicie Mutters, MD as Consulting Physician (Otolaryngology)    Assessment:    CPE Exercise Activities and Dietary recommendations Current Exercise Habits: Home exercise routine, Type of exercise: strength training/weights;walking;Other - see comments, Time (Minutes): > 60, Frequency (Times/Week): 5, Weekly Exercise (Minutes/Week): 0, Intensity: Moderate, Exercise limited by: None identified  Goals    None     Fall Risk Fall Risk  06/04/2015  Falls in the past year? Yes  Number falls in past yr: 1  Injury with Fall? Yes   Depression Screen PHQ 2/9 Scores 06/04/2015  PHQ - 2 Score 0     Cognitive Testing mmse 30/30  Immunization History  Administered Date(s) Administered  . Influenza Whole 11/10/2003  . Pneumococcal  Conjugate-13 07/24/2014  . Td 09/02/2007   Screening Tests Health Maintenance  Topic Date Due  . PNA vac Low Risk Adult (2 of 2 - PPSV23) 07/24/2015  . INFLUENZA VACCINE  08/10/2015  . MAMMOGRAM  10/11/2016  . TETANUS/TDAP  09/01/2017  . COLONOSCOPY  05/29/2021  . DEXA SCAN  Completed  . ZOSTAVAX  Addressed      Plan:    see AVS During the course of the visit the patient was educated and counseled about the following appropriate screening and preventive services:   Vaccines to include Pneumoccal, Influenza, Hepatitis B, Td, Zostavax, HCV  Electrocardiogram  Cardiovascular Disease  Colorectal cancer screening  Bone density screening  Diabetes screening  Glaucoma screening  Mammography/PAP  Nutrition counseling   Patient Instructions (the written plan) was given to the patient.  1. Hyperlipidemia Check labs - Comprehensive metabolic panel - Lipid panel - POCT urinalysis dipstick - CBC with Differential/Platelet - Lipid panel; Future - Comp Met (CMET); Future  2. Loss of height - DG Bone Density; Future  3. Osteopenia con't ca and vita d - DG Bone Density; Future  4. Estrogen deficiency  - DG Bone Density; Future  [place], DO  06/13/2015

## 2015-06-24 ENCOUNTER — Encounter: Payer: Self-pay | Admitting: Family Medicine

## 2015-07-20 ENCOUNTER — Other Ambulatory Visit: Payer: Self-pay | Admitting: Internal Medicine

## 2015-07-20 DIAGNOSIS — I10 Essential (primary) hypertension: Secondary | ICD-10-CM

## 2015-07-20 DIAGNOSIS — E785 Hyperlipidemia, unspecified: Secondary | ICD-10-CM

## 2015-08-06 ENCOUNTER — Other Ambulatory Visit: Payer: Self-pay | Admitting: Family Medicine

## 2015-08-06 DIAGNOSIS — Z1231 Encounter for screening mammogram for malignant neoplasm of breast: Secondary | ICD-10-CM

## 2015-08-06 DIAGNOSIS — M1612 Unilateral primary osteoarthritis, left hip: Secondary | ICD-10-CM | POA: Diagnosis not present

## 2015-08-10 DIAGNOSIS — H04123 Dry eye syndrome of bilateral lacrimal glands: Secondary | ICD-10-CM | POA: Diagnosis not present

## 2015-08-10 DIAGNOSIS — H25813 Combined forms of age-related cataract, bilateral: Secondary | ICD-10-CM | POA: Diagnosis not present

## 2015-08-16 ENCOUNTER — Telehealth: Payer: Self-pay | Admitting: Family Medicine

## 2015-08-16 NOTE — Telephone Encounter (Signed)
Patient is due for the pneumococcal. Please advise if it ok to schedule.     KP

## 2015-08-16 NOTE — Telephone Encounter (Signed)
ok 

## 2015-08-16 NOTE — Telephone Encounter (Signed)
Relation to [id number] Call back number:[phone]   Reason for call:  Patient would like orders for pneumonia vaccination. Please advise

## 2015-08-17 NOTE — Telephone Encounter (Signed)
Ok to schedule the pneumo 23 vaccine

## 2015-08-18 NOTE — Telephone Encounter (Signed)
Patient scheduled for 08/19/2015 at [place] with nurse.

## 2015-08-19 ENCOUNTER — Ambulatory Visit (INDEPENDENT_AMBULATORY_CARE_PROVIDER_SITE_OTHER): Payer: Medicare Other | Admitting: Behavioral Health

## 2015-08-19 DIAGNOSIS — Z23 Encounter for immunization: Secondary | ICD-10-CM

## 2015-08-19 NOTE — Progress Notes (Signed)
Pre visit review using our clinic review tool, if applicable. No additional management support is needed unless otherwise documented below in the visit note.  Patient in clinic today for pneumococcal vaccination. IM given in Left Deltoid. Patient tolerated injection well.

## 2015-09-22 DIAGNOSIS — H10413 Chronic giant papillary conjunctivitis, bilateral: Secondary | ICD-10-CM | POA: Diagnosis not present

## 2015-09-28 DIAGNOSIS — H9042 Sensorineural hearing loss, unilateral, left ear, with unrestricted hearing on the contralateral side: Secondary | ICD-10-CM | POA: Diagnosis not present

## 2015-09-28 DIAGNOSIS — H6063 Unspecified chronic otitis externa, bilateral: Secondary | ICD-10-CM | POA: Diagnosis not present

## 2015-09-28 DIAGNOSIS — H6123 Impacted cerumen, bilateral: Secondary | ICD-10-CM | POA: Diagnosis not present

## 2015-10-05 DIAGNOSIS — M67442 Ganglion, left hand: Secondary | ICD-10-CM | POA: Diagnosis not present

## 2015-10-05 DIAGNOSIS — M67441 Ganglion, right hand: Secondary | ICD-10-CM | POA: Diagnosis not present

## 2015-10-15 ENCOUNTER — Ambulatory Visit: Payer: Medicare Other

## 2015-10-26 DIAGNOSIS — H25813 Combined forms of age-related cataract, bilateral: Secondary | ICD-10-CM | POA: Diagnosis not present

## 2015-10-26 DIAGNOSIS — H401132 Primary open-angle glaucoma, bilateral, moderate stage: Secondary | ICD-10-CM | POA: Diagnosis not present

## 2015-11-08 ENCOUNTER — Ambulatory Visit
Admission: RE | Admit: 2015-11-08 | Discharge: 2015-11-08 | Disposition: A | Discharge status: Home / Self Care | Payer: Medicare Other | Source: Ambulatory Visit | Attending: Family Medicine | Admitting: Family Medicine

## 2015-11-08 DIAGNOSIS — Z1231 Encounter for screening mammogram for malignant neoplasm of breast: Secondary | ICD-10-CM | POA: Diagnosis not present

## 2015-12-14 DIAGNOSIS — H524 Presbyopia: Secondary | ICD-10-CM | POA: Diagnosis not present

## 2015-12-14 DIAGNOSIS — H401132 Primary open-angle glaucoma, bilateral, moderate stage: Secondary | ICD-10-CM | POA: Diagnosis not present

## 2015-12-14 DIAGNOSIS — H25813 Combined forms of age-related cataract, bilateral: Secondary | ICD-10-CM | POA: Diagnosis not present

## 2015-12-23 DIAGNOSIS — H401132 Primary open-angle glaucoma, bilateral, moderate stage: Secondary | ICD-10-CM | POA: Diagnosis not present

## 2015-12-23 DIAGNOSIS — H524 Presbyopia: Secondary | ICD-10-CM | POA: Diagnosis not present

## 2015-12-23 DIAGNOSIS — H25813 Combined forms of age-related cataract, bilateral: Secondary | ICD-10-CM | POA: Diagnosis not present

## 2016-01-10 HISTORY — PX: EYE SURGERY: SHX253

## 2016-02-07 DIAGNOSIS — M1612 Unilateral primary osteoarthritis, left hip: Secondary | ICD-10-CM | POA: Diagnosis not present

## 2016-02-07 DIAGNOSIS — H409 Unspecified glaucoma: Secondary | ICD-10-CM | POA: Diagnosis not present

## 2016-02-07 DIAGNOSIS — Z6820 Body mass index (BMI) 20.0-20.9, adult: Secondary | ICD-10-CM | POA: Diagnosis not present

## 2016-02-07 DIAGNOSIS — A159 Respiratory tuberculosis unspecified: Secondary | ICD-10-CM | POA: Diagnosis not present

## 2016-02-07 DIAGNOSIS — E784 Other hyperlipidemia: Secondary | ICD-10-CM | POA: Diagnosis not present

## 2016-02-07 DIAGNOSIS — M859 Disorder of bone density and structure, unspecified: Secondary | ICD-10-CM | POA: Diagnosis not present

## 2016-02-17 DIAGNOSIS — Z6836 Body mass index (BMI) 36.0-36.9, adult: Secondary | ICD-10-CM | POA: Diagnosis not present

## 2016-02-17 DIAGNOSIS — I491 Atrial premature depolarization: Secondary | ICD-10-CM | POA: Diagnosis not present

## 2016-03-29 DIAGNOSIS — H401132 Primary open-angle glaucoma, bilateral, moderate stage: Secondary | ICD-10-CM | POA: Diagnosis not present

## 2016-03-29 DIAGNOSIS — H25813 Combined forms of age-related cataract, bilateral: Secondary | ICD-10-CM | POA: Diagnosis not present

## 2016-03-29 DIAGNOSIS — H524 Presbyopia: Secondary | ICD-10-CM | POA: Diagnosis not present

## 2016-05-04 ENCOUNTER — Other Ambulatory Visit: Payer: Self-pay | Admitting: Family Medicine

## 2016-05-04 DIAGNOSIS — Z1231 Encounter for screening mammogram for malignant neoplasm of breast: Secondary | ICD-10-CM

## 2016-05-16 DIAGNOSIS — H25813 Combined forms of age-related cataract, bilateral: Secondary | ICD-10-CM | POA: Diagnosis not present

## 2016-05-16 DIAGNOSIS — H524 Presbyopia: Secondary | ICD-10-CM | POA: Diagnosis not present

## 2016-05-16 DIAGNOSIS — H401132 Primary open-angle glaucoma, bilateral, moderate stage: Secondary | ICD-10-CM | POA: Diagnosis not present

## 2016-05-31 DIAGNOSIS — H524 Presbyopia: Secondary | ICD-10-CM | POA: Diagnosis not present

## 2016-05-31 DIAGNOSIS — H25813 Combined forms of age-related cataract, bilateral: Secondary | ICD-10-CM | POA: Diagnosis not present

## 2016-05-31 DIAGNOSIS — H401132 Primary open-angle glaucoma, bilateral, moderate stage: Secondary | ICD-10-CM | POA: Diagnosis not present

## 2016-06-06 ENCOUNTER — Encounter: Payer: Medicare Other | Admitting: Family Medicine

## 2016-06-13 DIAGNOSIS — M859 Disorder of bone density and structure, unspecified: Secondary | ICD-10-CM | POA: Diagnosis not present

## 2016-06-13 DIAGNOSIS — E784 Other hyperlipidemia: Secondary | ICD-10-CM | POA: Diagnosis not present

## 2016-06-20 DIAGNOSIS — Z1212 Encounter for screening for malignant neoplasm of rectum: Secondary | ICD-10-CM | POA: Diagnosis not present

## 2016-06-22 DIAGNOSIS — M1612 Unilateral primary osteoarthritis, left hip: Secondary | ICD-10-CM | POA: Diagnosis not present

## 2016-06-28 DIAGNOSIS — M8580 Other specified disorders of bone density and structure, unspecified site: Secondary | ICD-10-CM | POA: Diagnosis not present

## 2016-06-28 DIAGNOSIS — E784 Other hyperlipidemia: Secondary | ICD-10-CM | POA: Diagnosis not present

## 2016-06-28 DIAGNOSIS — M1612 Unilateral primary osteoarthritis, left hip: Secondary | ICD-10-CM | POA: Diagnosis not present

## 2016-06-28 DIAGNOSIS — Z6820 Body mass index (BMI) 20.0-20.9, adult: Secondary | ICD-10-CM | POA: Diagnosis not present

## 2016-06-28 DIAGNOSIS — H409 Unspecified glaucoma: Secondary | ICD-10-CM | POA: Diagnosis not present

## 2016-06-28 DIAGNOSIS — Z0000 Encounter for general adult medical examination without abnormal findings: Secondary | ICD-10-CM | POA: Diagnosis not present

## 2016-06-28 DIAGNOSIS — I491 Atrial premature depolarization: Secondary | ICD-10-CM | POA: Diagnosis not present

## 2016-06-28 DIAGNOSIS — Z1389 Encounter for screening for other disorder: Secondary | ICD-10-CM | POA: Diagnosis not present

## 2016-07-31 ENCOUNTER — Telehealth: Payer: Self-pay | Admitting: General Practice

## 2016-07-31 NOTE — Telephone Encounter (Signed)
Just FYI. Pt no longer see's provider [facility].

## 2016-09-05 DIAGNOSIS — H401132 Primary open-angle glaucoma, bilateral, moderate stage: Secondary | ICD-10-CM | POA: Diagnosis not present

## 2016-09-05 DIAGNOSIS — H01112 Allergic dermatitis of right lower eyelid: Secondary | ICD-10-CM | POA: Diagnosis not present

## 2016-09-05 DIAGNOSIS — H01111 Allergic dermatitis of right upper eyelid: Secondary | ICD-10-CM | POA: Diagnosis not present

## 2016-09-05 DIAGNOSIS — H25813 Combined forms of age-related cataract, bilateral: Secondary | ICD-10-CM | POA: Diagnosis not present

## 2016-09-05 DIAGNOSIS — H524 Presbyopia: Secondary | ICD-10-CM | POA: Diagnosis not present

## 2016-10-10 DIAGNOSIS — H01001 Unspecified blepharitis right upper eyelid: Secondary | ICD-10-CM | POA: Diagnosis not present

## 2016-10-10 DIAGNOSIS — H01004 Unspecified blepharitis left upper eyelid: Secondary | ICD-10-CM | POA: Diagnosis not present

## 2016-10-10 DIAGNOSIS — H01002 Unspecified blepharitis right lower eyelid: Secondary | ICD-10-CM | POA: Diagnosis not present

## 2016-10-10 DIAGNOSIS — H01005 Unspecified blepharitis left lower eyelid: Secondary | ICD-10-CM | POA: Diagnosis not present

## 2016-10-18 DIAGNOSIS — H9042 Sensorineural hearing loss, unilateral, left ear, with unrestricted hearing on the contralateral side: Secondary | ICD-10-CM | POA: Diagnosis not present

## 2016-10-18 DIAGNOSIS — H6123 Impacted cerumen, bilateral: Secondary | ICD-10-CM | POA: Diagnosis not present

## 2016-10-18 DIAGNOSIS — H6063 Unspecified chronic otitis externa, bilateral: Secondary | ICD-10-CM | POA: Diagnosis not present

## 2016-11-08 ENCOUNTER — Ambulatory Visit
Admission: RE | Admit: 2016-11-08 | Discharge: 2016-11-08 | Disposition: A | Discharge status: Home / Self Care | Payer: Medicare Other | Source: Ambulatory Visit | Attending: Family Medicine | Admitting: Family Medicine

## 2016-11-08 DIAGNOSIS — Z1231 Encounter for screening mammogram for malignant neoplasm of breast: Secondary | ICD-10-CM | POA: Diagnosis not present

## 2016-11-21 DIAGNOSIS — H401132 Primary open-angle glaucoma, bilateral, moderate stage: Secondary | ICD-10-CM | POA: Diagnosis not present

## 2016-11-21 DIAGNOSIS — H01001 Unspecified blepharitis right upper eyelid: Secondary | ICD-10-CM | POA: Diagnosis not present

## 2016-11-21 DIAGNOSIS — H2513 Age-related nuclear cataract, bilateral: Secondary | ICD-10-CM | POA: Diagnosis not present

## 2016-11-21 DIAGNOSIS — H01005 Unspecified blepharitis left lower eyelid: Secondary | ICD-10-CM | POA: Diagnosis not present

## 2016-11-21 DIAGNOSIS — H01002 Unspecified blepharitis right lower eyelid: Secondary | ICD-10-CM | POA: Diagnosis not present

## 2016-11-21 DIAGNOSIS — H01004 Unspecified blepharitis left upper eyelid: Secondary | ICD-10-CM | POA: Diagnosis not present

## 2017-04-03 DIAGNOSIS — H524 Presbyopia: Secondary | ICD-10-CM | POA: Diagnosis not present

## 2017-04-03 DIAGNOSIS — H2513 Age-related nuclear cataract, bilateral: Secondary | ICD-10-CM | POA: Diagnosis not present

## 2017-04-03 DIAGNOSIS — H401132 Primary open-angle glaucoma, bilateral, moderate stage: Secondary | ICD-10-CM | POA: Diagnosis not present

## 2017-04-04 ENCOUNTER — Other Ambulatory Visit: Payer: Self-pay | Admitting: Internal Medicine

## 2017-04-04 DIAGNOSIS — Z1231 Encounter for screening mammogram for malignant neoplasm of breast: Secondary | ICD-10-CM

## 2017-06-28 DIAGNOSIS — R82998 Other abnormal findings in urine: Secondary | ICD-10-CM | POA: Diagnosis not present

## 2017-06-28 DIAGNOSIS — M859 Disorder of bone density and structure, unspecified: Secondary | ICD-10-CM | POA: Diagnosis not present

## 2017-06-28 DIAGNOSIS — E7849 Other hyperlipidemia: Secondary | ICD-10-CM | POA: Diagnosis not present

## 2017-06-29 DIAGNOSIS — M1612 Unilateral primary osteoarthritis, left hip: Secondary | ICD-10-CM | POA: Diagnosis not present

## 2017-07-04 DIAGNOSIS — M859 Disorder of bone density and structure, unspecified: Secondary | ICD-10-CM | POA: Diagnosis not present

## 2017-07-04 DIAGNOSIS — Z0000 Encounter for general adult medical examination without abnormal findings: Secondary | ICD-10-CM | POA: Diagnosis not present

## 2017-07-04 DIAGNOSIS — Z23 Encounter for immunization: Secondary | ICD-10-CM | POA: Diagnosis not present

## 2017-07-04 DIAGNOSIS — H268 Other specified cataract: Secondary | ICD-10-CM | POA: Diagnosis not present

## 2017-07-04 DIAGNOSIS — M1612 Unilateral primary osteoarthritis, left hip: Secondary | ICD-10-CM | POA: Diagnosis not present

## 2017-07-04 DIAGNOSIS — Z681 Body mass index (BMI) 19 or less, adult: Secondary | ICD-10-CM | POA: Diagnosis not present

## 2017-07-04 DIAGNOSIS — E7849 Other hyperlipidemia: Secondary | ICD-10-CM | POA: Diagnosis not present

## 2017-07-04 DIAGNOSIS — L728 Other follicular cysts of the skin and subcutaneous tissue: Secondary | ICD-10-CM | POA: Diagnosis not present

## 2017-07-04 DIAGNOSIS — M810 Age-related osteoporosis without current pathological fracture: Secondary | ICD-10-CM | POA: Diagnosis not present

## 2017-07-04 DIAGNOSIS — Z1389 Encounter for screening for other disorder: Secondary | ICD-10-CM | POA: Diagnosis not present

## 2017-07-04 DIAGNOSIS — R194 Change in bowel habit: Secondary | ICD-10-CM | POA: Diagnosis not present

## 2017-07-18 DIAGNOSIS — R2231 Localized swelling, mass and lump, right upper limb: Secondary | ICD-10-CM | POA: Diagnosis not present

## 2017-08-07 DIAGNOSIS — H01001 Unspecified blepharitis right upper eyelid: Secondary | ICD-10-CM | POA: Diagnosis not present

## 2017-08-07 DIAGNOSIS — H524 Presbyopia: Secondary | ICD-10-CM | POA: Diagnosis not present

## 2017-08-07 DIAGNOSIS — H01004 Unspecified blepharitis left upper eyelid: Secondary | ICD-10-CM | POA: Diagnosis not present

## 2017-08-07 DIAGNOSIS — H401132 Primary open-angle glaucoma, bilateral, moderate stage: Secondary | ICD-10-CM | POA: Diagnosis not present

## 2017-08-07 DIAGNOSIS — H01002 Unspecified blepharitis right lower eyelid: Secondary | ICD-10-CM | POA: Diagnosis not present

## 2017-08-21 DIAGNOSIS — H2512 Age-related nuclear cataract, left eye: Secondary | ICD-10-CM | POA: Diagnosis not present

## 2017-08-21 DIAGNOSIS — H2513 Age-related nuclear cataract, bilateral: Secondary | ICD-10-CM | POA: Diagnosis not present

## 2017-08-27 DIAGNOSIS — H25812 Combined forms of age-related cataract, left eye: Secondary | ICD-10-CM | POA: Diagnosis not present

## 2017-08-27 DIAGNOSIS — H2512 Age-related nuclear cataract, left eye: Secondary | ICD-10-CM | POA: Diagnosis not present

## 2017-09-04 DIAGNOSIS — H2511 Age-related nuclear cataract, right eye: Secondary | ICD-10-CM | POA: Diagnosis not present

## 2017-09-12 DIAGNOSIS — H2511 Age-related nuclear cataract, right eye: Secondary | ICD-10-CM | POA: Diagnosis not present

## 2017-09-12 DIAGNOSIS — H25811 Combined forms of age-related cataract, right eye: Secondary | ICD-10-CM | POA: Diagnosis not present

## 2017-10-01 ENCOUNTER — Other Ambulatory Visit (HOSPITAL_COMMUNITY): Payer: Self-pay | Admitting: *Deleted

## 2017-10-01 NOTE — Progress Notes (Signed)
Medical clearance note [facility] on chart for 10-17-17 surgery

## 2017-10-01 NOTE — H&P (Signed)
TOTAL HIP ADMISSION H&P  Patient is admitted for left total hip arthroplasty.  Subjective:  Chief Complaint: left hip pain  HPI: [name] [name], 77 y.o. female, has a history of pain and functional disability in the left hip(s) due to arthritis and patient has failed non-surgical conservative treatments for greater than 12 weeks to include NSAID's and/or analgesics, activity modification and supplements.  Onset of symptoms was gradual starting 10 years ago with gradually worsening course since that time.The patient noted no past surgery on the left hip(s).  Patient currently rates pain in the left hip at 8 out of 10 with activity. Patient has night pain, worsening of pain with activity and weight bearing and pain that interfers with activities of daily living. Patient has evidence of severe bone-on-bone arthritis with large subchondral cysts, slightly worse than 1 year ago by imaging studies. This condition presents safety issues increasing the risk of falls. There is no current active infection.  Patient Active Problem List   Diagnosis Date Noted  . Glaucoma 05/26/2013  . Edema 05/16/2012  . OSTEOARTHRITIS 09/11/2008  . Vitamin D deficiency 04/16/2008  . REACTIVE AIRWAY DISEASE 04/16/2008  . Hyperlipidemia 09/02/2007  . 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 IBS 04/16/2006  . Osteopenia 04/16/2006  . HX, PERSONAL, TUBERCULOSIS 04/16/2006   Past Medical History:  Diagnosis Date  . Allergy    hayf ever  . Arthritis    osteoarthritis  . Cataract   . Fibrocystic breast disease    resolved off caffeine & nicotine  . Glaucoma    Dr Hillis Range  . Heart murmur    2013 Frequent PAC's  . Osteopenia   . Other and unspecified hyperlipidemia   . 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 Vitamin D deficiency     Past Surgical History:  Procedure Laterality Date  . APPENDECTOMY    . BREAST BIOPSY     X 2   . BREAST EXCISIONAL BIOPSY Bilateral   . CESAREAN SECTION  1971  .  CHOLECYSTECTOMY    . COLONOSCOPY  [id number], 2013   all Negative , Dr.Brodie. Due 2023  . DILATION AND CURETTAGE OF UTERUS  1972   twice  . MOUTH SURGERY  03/2015  . MYRINGOTOMY  1946   bilateral  . NASAL SEPTUM SURGERY  1965  . REFRACTIVE SURGERY  12/2010   for Glaucoma, Dr Bing Plume  . TONSILLECTOMY    . TUBAL LIGATION      No current facility-administered medications for this encounter.    Current Outpatient Medications  Medication Sig Dispense Refill Last Dose  . Calcium Citrate-Vitamin D (CALCIUM + D PO) Take 1 tablet by mouth 3 (three) times daily. 1000 mg calcium, 1000 units vitamin d     . dorzolamide (TRUSOPT) 2 % ophthalmic solution Place 1 drop into both eyes 2 (two) times daily.     Marland Kitchen EPINEPHrine (EPI-PEN) 0.3 mg/0.3 mL DEVI Inject 0.3 mLs (0.3 mg total) into the muscle once. 1 Device 2 Taking  . Glucosamine-Chondroitin (GLUCOSAMINE CHONDR COMPLEX PO) Take 2 tablets by mouth 2 (two) times daily.     . Omega-3 Fatty Acids (OMEGA 3 PO) Take 1 capsule by mouth daily. EPA 800 DHA 124 mg once daily    Taking  . OVER THE COUNTER MEDICATION Take 1 capsule by mouth 2 (two) times daily. HydroEye otc supplement     . OVER THE COUNTER MEDICATION Take 8 oz by mouth daily. Kombucha probiotic     .  spironolactone (ALDACTONE) 25 MG tablet Take 1 tablet by mouth  daily (Patient taking differently: Take 25 mg by mouth every evening. ) 90 tablet 3   . travoprost, benzalkonium, (TRAVATAN) 0.004 % ophthalmic solution Place 1 drop into both eyes at bedtime.    Taking  . TURMERIC PO Take 750 mg by mouth 2 (two) times daily.     Marland Kitchen Ubiquinol 100 MG CAPS Take 100 mg by mouth daily.    Taking  . simvastatin (ZOCOR) 20 MG tablet Take 1 tablet by mouth at  bedtime (Patient not taking: Reported on 09/28/2017) 90 tablet 2 Not Taking at Unknown time   Allergies  Allergen Reactions  . Bee Venom Anaphylaxis    Highly allergic to [employer]   . Latex Dermatitis    Powder in latex    Social History    Tobacco Use  . Smoking status: Former Smoker    Last attempt to quit: 01/09/1982    Years since quitting: 35.7  . Smokeless tobacco: Never Used  . Tobacco comment: smoked 1958-1984, up to 1 ppd  Substance Use Topics  . Alcohol use: Yes    Alcohol/week: 5.0 standard drinks    Types: 5 Glasses of wine per week    Comment: Socially    Family History  Problem Relation Age of Onset  . Colon cancer Father 28  . Parkinsonism Father   . Prostate cancer Father   . Glaucoma Father   . Osteopenia Mother   . Heart attack Mother 52       non smoker  . Peripheral vascular disease Mother        Status post carotid endarterectomy  . Thyroid disease Sister        X 2 sisters  . Hypertension Sister        X 2 sisters  . Osteopenia Sister   . Hyperlipidemia Sister   . Arthritis Brother        OA  . Gout Brother   . Hyperlipidemia Brother   . Hypertension Brother   . Diabetes Paternal Aunt   . Hypertension Maternal Grandfather   . Heart attack Maternal Grandfather 43  . Aortic aneurysm Maternal Grandfather   . Heart attack Maternal Uncle        >55  . Diabetes Paternal Grandmother   . Cancer Paternal Grandmother        esophageal  . Pancreatic cancer Paternal Grandmother   . Coronary artery disease Maternal Grandmother   . Hypertension Maternal Grandmother   . Osteopenia Maternal Grandmother   . Lung disease Sister        fungal infection acquired  in [place]  . Osteoarthritis Sister   . Breast cancer Neg Hx      Review of Systems  Constitutional: Negative for chills and fever.  HENT: Negative for congestion, sore throat and tinnitus.   Eyes: Negative for double vision, photophobia and pain.  Respiratory: Negative for cough, shortness of breath and wheezing.   Cardiovascular: Negative for chest pain, palpitations and orthopnea.  Gastrointestinal: Negative for heartburn, nausea and vomiting.  Genitourinary: Negative for dysuria, frequency and urgency.  Musculoskeletal:  Positive for joint pain.  Neurological: Negative for dizziness, weakness and headaches.  Psychiatric/Behavioral: Negative for depression.    Objective:  Physical Exam  Well nourished and well developed. General: Alert and oriented x3, cooperative and pleasant, no acute distress. Head: normocephalic, atraumatic, neck supple. Eyes: EOMI. Respiratory: breath sounds clear in all fields, no wheezing, rales, or rhonchi.  Cardiovascular: Regular rate and rhythm, no murmurs, gallops or rubs.  Abdomen: non-tender to palpation and soft, normoactive bowel sounds. Musculoskeletal: Significantly antalgic gait pattern on the left. Right Hip Exam: ROM: Normal without discomfort. There is no tenderness over the greater trochanter. There is no pain on provocative testing of the hip Left Hip Exam: ROM: Flexion to 90, Internal Rotation 0, External Rotation 0, and Abduction 5 degrees. There is no tenderness over the greater trochanter. Calves soft and nontender. Motor function intact in LE. Strength 5/5 LE bilaterally. Neuro: Distal pulses 2+. Sensation to light touch intact in LE.  Vital signs in last 24 hours: Blood pressure: 128/84 mmHg Pulse: 84 bpm   Labs:   Estimated body mass index is 20.09 kg/m as calculated from the following:   Height as of 06/04/15: 4' 11.75" (1.518 m).   Weight as of 06/04/15: 46.3 kg.   Imaging Review Plain radiographs demonstrate severe degenerative joint disease of the left hip(s). The bone quality appears to be adequate for age and reported activity level.    Preoperative templating of the joint replacement has been completed, documented, and submitted to the Operating Room personnel in order to optimize intra-operative equipment management.     Assessment/Plan:  End stage arthritis, left hip(s)  The patient history, physical examination, clinical judgement of the provider and imaging studies are consistent with end stage degenerative joint disease of the  left hip(s) and total hip arthroplasty is deemed medically necessary. The treatment options including medical management, injection therapy, arthroscopy and arthroplasty were discussed at length. The risks and benefits of total hip arthroplasty were presented and reviewed. The risks due to aseptic loosening, infection, stiffness, dislocation/subluxation,  thromboembolic complications and other imponderables were discussed.  The patient acknowledged the explanation, agreed to proceed with the plan and consent was signed. Patient is being admitted for inpatient treatment for surgery, pain control, PT, OT, prophylactic antibiotics, VTE prophylaxis, progressive ambulation and ADL's and discharge planning.The patient is planning to be discharged home with home exercise program.    Therapy Plans: HEP Disposition: Home with daughter (will stay at [facility] due to husband with cognitive impairment) Planned DVT Prophylaxis: Aspirin 325 mg BID DME needed: [facility] PCP: Dr. Ardeth Perfect (medical clearance provided) TXA: IV Allergies: NKDA Other: Left femoral neck DEXA score -2.5  - Patient was instructed on what medications to stop prior to surgery. - Follow-up visit in 2 weeks with Dr. Wynelle Link - Begin physical therapy following surgery - Pre-operative lab work as pre-surgical testing - Prescriptions will be provided in hospital at time of discharge  Theresa Duty, PA-C Orthopedic Surgery [facility] [place]

## 2017-10-01 NOTE — Patient Instructions (Signed)
[name] [name]  10/01/2017   Your procedure is scheduled on: 10-17-17  Report to [facility] Main  Entrance  Report to admitting at 900 AM    Call this number if you have problems the morning of surgery [phone]   Remember: Do not eat food or drink liquids :After Midnight. BRUSH YOUR TEETH MORNING OF SURGERY AND RINSE YOUR MOUTH OUT, NO CHEWING GUM CANDY OR MINTS.     Take these medicines the morning of surgery with A SIP OF WATER: none                                You may not have any metal on your body including hair pins and              piercings  Do not wear jewelry, make-up, lotions, powders or perfumes, deodorant             Do not wear nail polish.  Do not shave  48 hours prior to surgery.              Men may shave face and neck.   Do not bring valuables to the hospital. [place].  Contacts, dentures or bridgework may not be worn into surgery.  Leave suitcase in the car. After surgery it may be brought to your room.     Patients discharged the day of surgery will not be allowed to drive home.  Name and phone number of your driver:  Special Instructions: N/A              Please read over the following fact sheets you were given: _____________________________________________________________________             [facility] - Preparing for Surgery Before surgery, you can play an important role.  Because skin is not sterile, your skin needs to be as free of germs as possible.  You can reduce the number of germs on your skin by washing with CHG (chlorahexidine gluconate) soap before surgery.  CHG is an antiseptic cleaner which kills germs and bonds with the skin to continue killing germs even after washing. Please DO NOT use if you have an allergy to CHG or antibacterial soaps.  If your skin becomes reddened/irritated stop using the CHG and inform your nurse when you arrive at Short Stay. Do not  shave (including legs and underarms) for at least 48 hours prior to the first CHG shower.  You may shave your face/neck. Please follow these instructions carefully:  1.  Shower with CHG Soap the night before surgery and the  morning of Surgery.  2.  If you choose to wash your hair, wash your hair first as usual with your  normal  shampoo.  3.  After you shampoo, rinse your hair and body thoroughly to remove the  shampoo.                           4.  Use CHG as you would any other liquid soap.  You can apply chg directly  to the skin and wash  Gently with a scrungie or clean washcloth.  5.  Apply the CHG Soap to your body ONLY FROM THE NECK DOWN.   Do not use on face/ open                           Wound or open sores. Avoid contact with eyes, ears mouth and genitals (private parts).                       Wash face,  Genitals (private parts) with your normal soap.             6.  Wash thoroughly, paying special attention to the area where your surgery  will be performed.  7.  Thoroughly rinse your body with warm water from the neck down.  8.  DO NOT shower/wash with your normal soap after using and rinsing off  the CHG Soap.                9.  Pat yourself dry with a clean towel.            10.  Wear clean pajamas.            11.  Place clean sheets on your bed the night of your first shower and do not  sleep with pets. Day of Surgery : Do not apply any lotions/deodorants the morning of surgery.  Please wear clean clothes to the hospital/surgery center.  FAILURE TO FOLLOW THESE INSTRUCTIONS MAY RESULT IN THE CANCELLATION OF YOUR SURGERY PATIENT SIGNATURE_________________________________  NURSE SIGNATURE__________________________________  ________________________________________________________________________   Adam Phenix  An incentive spirometer is a tool that can help keep your lungs clear and active. This tool measures how well you are filling your lungs  with each breath. Taking long deep breaths may help reverse or decrease the chance of developing breathing (pulmonary) problems (especially infection) following:  A long period of time when you are unable to move or be active. BEFORE THE PROCEDURE   If the spirometer includes an indicator to show your best effort, your nurse or respiratory therapist will set it to a desired goal.  If possible, sit up straight or lean slightly forward. Try not to slouch.  Hold the incentive spirometer in an upright position. INSTRUCTIONS FOR USE  1. Sit on the edge of your bed if possible, or sit up as far as you can in bed or on a chair. 2. Hold the incentive spirometer in an upright position. 3. Breathe out normally. 4. Place the mouthpiece in your mouth and seal your lips tightly around it. 5. Breathe in slowly and as deeply as possible, raising the piston or the ball toward the top of the column. 6. Hold your breath for 3-5 seconds or for as long as possible. Allow the piston or ball to fall to the bottom of the column. 7. Remove the mouthpiece from your mouth and breathe out normally. 8. Rest for a few seconds and repeat Steps 1 through 7 at least 10 times every 1-2 hours when you are awake. Take your time and take a few normal breaths between deep breaths. 9. The spirometer may include an indicator to show your best effort. Use the indicator as a goal to work toward during each repetition. 10. After each set of 10 deep breaths, practice coughing to be sure your lungs are clear. If you have an incision (the cut made at the time of surgery),  support your incision when coughing by placing a pillow or rolled up towels firmly against it. Once you are able to get out of bed, walk around indoors and cough well. You may stop using the incentive spirometer when instructed by your caregiver.  RISKS AND COMPLICATIONS  Take your time so you do not get dizzy or light-headed.  If you are in pain, you may need to  take or ask for pain medication before doing incentive spirometry. It is harder to take a deep breath if you are having pain. AFTER USE  Rest and breathe slowly and easily.  It can be helpful to keep track of a log of your progress. Your caregiver can provide you with a simple table to help with this. If you are using the spirometer at home, follow these instructions: [place] IF:   You are having difficultly using the spirometer.  You have trouble using the spirometer as often as instructed.  Your pain medication is not giving enough relief while using the spirometer.  You develop fever of 100.5 F (38.1 C) or higher. SEEK IMMEDIATE MEDICAL CARE IF:   You cough up bloody sputum that had not been present before.  You develop fever of 102 F (38.9 C) or greater.  You develop worsening pain at or near the incision site. MAKE SURE YOU:   Understand these instructions.  Will watch your condition.  Will get help right away if you are not doing well or get worse. Document Released: 05/08/2006 Document Revised: 03/20/2011 Document Reviewed: 07/09/2006 Exit[employer] Patient Information 2014 [employer], [place].   ________________________________________________________________________  WHAT IS A BLOOD TRANSFUSION? Blood Transfusion Information  A transfusion is the replacement of blood or some of its parts. Blood is made up of multiple cells which provide different functions.  Red blood cells carry oxygen and are used for blood loss replacement.  White blood cells fight against infection.  Platelets control bleeding.  Plasma helps clot blood.  Other blood products are available for specialized needs, such as hemophilia or other clotting disorders. BEFORE THE TRANSFUSION  Who gives blood for transfusions?   Healthy volunteers who are fully evaluated to make sure their blood is safe. This is blood bank blood. Transfusion therapy is the safest it has ever been in the  practice of medicine. Before blood is taken from a donor, a complete history is taken to make sure that person has no history of diseases nor engages in risky social behavior (examples are intravenous drug use or sexual activity with multiple partners). The donor's travel history is screened to minimize risk of transmitting infections, such as malaria. The donated blood is tested for signs of infectious diseases, such as HIV and hepatitis. The blood is then tested to be sure it is compatible with you in order to minimize the chance of a transfusion reaction. If you or a relative donates blood, this is often done in anticipation of surgery and is not appropriate for emergency situations. It takes many days to process the donated blood. RISKS AND COMPLICATIONS Although transfusion therapy is very safe and saves many lives, the main dangers of transfusion include:   Getting an infectious disease.  Developing a transfusion reaction. This is an allergic reaction to something in the blood you were given. Every precaution is taken to prevent this. The decision to have a blood transfusion has been considered carefully by your caregiver before blood is given. Blood is not given unless the benefits outweigh the risks. AFTER THE TRANSFUSION  Right after receiving a blood transfusion, you will usually feel much better and more energetic. This is especially true if your red blood cells have gotten low (anemic). The transfusion raises the level of the red blood cells which carry oxygen, and this usually causes an energy increase.  The nurse administering the transfusion will monitor you carefully for complications. HOME CARE INSTRUCTIONS  No special instructions are needed after a transfusion. You may find your energy is better. Speak with your caregiver about any limitations on activity for underlying diseases you may have. SEEK MEDICAL CARE IF:   Your condition is not improving after your transfusion.  You  develop redness or irritation at the intravenous (IV) site. SEEK IMMEDIATE MEDICAL CARE IF:  Any of the following symptoms occur over the next 12 hours:  Shaking chills.  You have a temperature by mouth above 102 F (38.9 C), not controlled by medicine.  Chest, back, or muscle pain.  People around you feel you are not acting correctly or are confused.  Shortness of breath or difficulty breathing.  Dizziness and fainting.  You get a rash or develop hives.  You have a decrease in urine output.  Your urine turns a dark color or changes to pink, red, or brown. Any of the following symptoms occur over the next 10 days:  You have a temperature by mouth above 102 F (38.9 C), not controlled by medicine.  Shortness of breath.  Weakness after normal activity.  The white part of the eye turns yellow (jaundice).  You have a decrease in the amount of urine or are urinating less often.  Your urine turns a dark color or changes to pink, red, or brown. Document Released: 12/24/1999 Document Revised: 03/20/2011 Document Reviewed: 08/12/2007 [facility] Patient Information 2014 [place], [place].  _______________________________________________________________________

## 2017-10-04 ENCOUNTER — Other Ambulatory Visit: Payer: Self-pay

## 2017-10-04 ENCOUNTER — Encounter (HOSPITAL_COMMUNITY): Payer: Self-pay

## 2017-10-04 ENCOUNTER — Encounter (HOSPITAL_COMMUNITY)
Admission: RE | Admit: 2017-10-04 | Discharge: 2017-10-04 | Disposition: A | Discharge status: Home / Self Care | Payer: Medicare Other | Source: Ambulatory Visit | Attending: Orthopedic Surgery | Admitting: Orthopedic Surgery

## 2017-10-04 DIAGNOSIS — R9431 Abnormal electrocardiogram [ECG] [EKG]: Secondary | ICD-10-CM | POA: Diagnosis not present

## 2017-10-04 DIAGNOSIS — Z01818 Encounter for other preprocedural examination: Secondary | ICD-10-CM | POA: Insufficient documentation

## 2017-10-04 DIAGNOSIS — M1612 Unilateral primary osteoarthritis, left hip: Secondary | ICD-10-CM | POA: Diagnosis not present

## 2017-10-04 DIAGNOSIS — I491 Atrial premature depolarization: Secondary | ICD-10-CM | POA: Insufficient documentation

## 2017-10-04 HISTORY — DX: Unspecified glaucoma: H40.9

## 2017-10-04 HISTORY — DX: Failed or difficult intubation, initial encounter: T88.4XXA

## 2017-10-04 HISTORY — DX: Gastro-esophageal reflux disease without esophagitis: K21.9

## 2017-10-04 LAB — CBC
HCT: 45.1 % (ref 36.0–46.0)
Hemoglobin: 14.6 g/dL (ref 12.0–15.0)
MCH: 31.6 pg (ref 26.0–34.0)
MCHC: 32.4 g/dL (ref 30.0–36.0)
MCV: 97.6 fL (ref 78.0–100.0)
PLATELETS: 237 10*3/uL (ref 150–400)
RBC: 4.62 MIL/uL (ref 3.87–5.11)
RDW: 13 % (ref 11.5–15.5)
WBC: 7.2 10*3/uL (ref 4.0–10.5)

## 2017-10-04 LAB — COMPREHENSIVE METABOLIC PANEL
ALT: 21 U/L (ref 0–44)
ANION GAP: 10 (ref 5–15)
AST: 27 U/L (ref 15–41)
Albumin: 4.1 g/dL (ref 3.5–5.0)
Alkaline Phosphatase: 59 U/L (ref 38–126)
BUN: 24 mg/dL — ABNORMAL HIGH (ref 8–23)
CHLORIDE: 110 mmol/L (ref 98–111)
CO2: 24 mmol/L (ref 22–32)
CREATININE: 0.85 mg/dL (ref 0.44–1.00)
Calcium: 9.9 mg/dL (ref 8.9–10.3)
GFR calc non Af Amer: >60 mL/min (ref >60)
Glucose, Bld: 117 mg/dL — ABNORMAL HIGH (ref 70–99)
POTASSIUM: 4.5 mmol/L (ref 3.5–5.1)
SODIUM: 144 mmol/L (ref 135–145)
Total Bilirubin: 1.2 mg/dL (ref 0.3–1.2)
Total Protein: 7.3 g/dL (ref 6.5–8.1)

## 2017-10-04 LAB — PROTIME-INR
INR: 0.97
Prothrombin Time: 12.7 seconds (ref 11.4–15.2)

## 2017-10-04 LAB — SURGICAL PCR SCREEN
MRSA, PCR: NEGATIVE
STAPHYLOCOCCUS AUREUS: NEGATIVE

## 2017-10-04 LAB — APTT: aPTT: 31 seconds (ref 24–36)

## 2017-10-04 NOTE — Progress Notes (Signed)
lov dr Jackelyn Poling 07-05-17 on chart Hip xray 06-28-17 on chart Spine xray 06-28-17 on chart Old ekg 02-18-16 on chart

## 2017-10-17 ENCOUNTER — Inpatient Hospital Stay (HOSPITAL_COMMUNITY): Payer: Medicare Other | Admitting: Certified Registered"

## 2017-10-17 ENCOUNTER — Inpatient Hospital Stay (HOSPITAL_COMMUNITY)
Admission: RE | Admit: 2017-10-17 | Discharge: 2017-10-18 | DRG: 470 | Disposition: A | Discharge status: Home / Self Care | Payer: Medicare Other | Source: Ambulatory Visit | Attending: Orthopedic Surgery | Admitting: Orthopedic Surgery

## 2017-10-17 ENCOUNTER — Inpatient Hospital Stay (HOSPITAL_COMMUNITY): Payer: Medicare Other

## 2017-10-17 ENCOUNTER — Encounter (HOSPITAL_COMMUNITY): Payer: Self-pay | Admitting: General Practice

## 2017-10-17 ENCOUNTER — Encounter (HOSPITAL_COMMUNITY)
Admission: RE | Disposition: A | Discharge status: Home / Self Care | Payer: Self-pay | Source: Ambulatory Visit | Attending: Orthopedic Surgery

## 2017-10-17 ENCOUNTER — Other Ambulatory Visit: Payer: Self-pay

## 2017-10-17 DIAGNOSIS — Z884 Allergy status to anesthetic agent status: Secondary | ICD-10-CM

## 2017-10-17 DIAGNOSIS — Z91030 Bee allergy status: Secondary | ICD-10-CM | POA: Diagnosis not present

## 2017-10-17 DIAGNOSIS — H409 Unspecified glaucoma: Secondary | ICD-10-CM | POA: Diagnosis present

## 2017-10-17 DIAGNOSIS — Z87891 Personal history of nicotine dependence: Secondary | ICD-10-CM

## 2017-10-17 DIAGNOSIS — Z9181 History of falling: Secondary | ICD-10-CM | POA: Diagnosis not present

## 2017-10-17 DIAGNOSIS — Z471 Aftercare following joint replacement surgery: Secondary | ICD-10-CM | POA: Diagnosis not present

## 2017-10-17 DIAGNOSIS — K219 Gastro-esophageal reflux disease without esophagitis: Secondary | ICD-10-CM | POA: Diagnosis not present

## 2017-10-17 DIAGNOSIS — Z419 Encounter for procedure for purposes other than remedying health state, unspecified: Secondary | ICD-10-CM

## 2017-10-17 DIAGNOSIS — Z886 Allergy status to analgesic agent status: Secondary | ICD-10-CM | POA: Diagnosis not present

## 2017-10-17 DIAGNOSIS — Z79899 Other long term (current) drug therapy: Secondary | ICD-10-CM | POA: Diagnosis not present

## 2017-10-17 DIAGNOSIS — M8580 Other specified disorders of bone density and structure, unspecified site: Secondary | ICD-10-CM | POA: Diagnosis present

## 2017-10-17 DIAGNOSIS — Z96642 Presence of left artificial hip joint: Secondary | ICD-10-CM | POA: Diagnosis not present

## 2017-10-17 DIAGNOSIS — Z888 Allergy status to other drugs, medicaments and biological substances status: Secondary | ICD-10-CM | POA: Diagnosis not present

## 2017-10-17 DIAGNOSIS — E785 Hyperlipidemia, unspecified: Secondary | ICD-10-CM | POA: Diagnosis present

## 2017-10-17 DIAGNOSIS — M1612 Unilateral primary osteoarthritis, left hip: Principal | ICD-10-CM | POA: Diagnosis present

## 2017-10-17 DIAGNOSIS — Z91040 Latex allergy status: Secondary | ICD-10-CM

## 2017-10-17 DIAGNOSIS — Z96649 Presence of unspecified artificial hip joint: Secondary | ICD-10-CM

## 2017-10-17 DIAGNOSIS — M169 Osteoarthritis of hip, unspecified: Secondary | ICD-10-CM | POA: Diagnosis present

## 2017-10-17 HISTORY — PX: TOTAL HIP ARTHROPLASTY: SHX124

## 2017-10-17 LAB — TYPE AND SCREEN
ABO/RH(D): O POS
Antibody Screen: NEGATIVE

## 2017-10-17 LAB — ABO/RH: ABO/RH(D): O POS

## 2017-10-17 SURGERY — ARTHROPLASTY, HIP, TOTAL, ANTERIOR APPROACH
Anesthesia: Spinal | Site: Hip | Laterality: Left

## 2017-10-17 MED ORDER — DEXAMETHASONE SODIUM PHOSPHATE 10 MG/ML IJ SOLN
10.0000 mg | Freq: Once | INTRAMUSCULAR | Status: AC
  Administered 2017-10-18: 10 mg via INTRAVENOUS
  Filled 2017-10-17: qty 1

## 2017-10-17 MED ORDER — RIVAROXABAN 10 MG PO TABS
10.0000 mg | ORAL_TABLET | Freq: Every day | ORAL | Status: DC
  Administered 2017-10-18: 10 mg via ORAL
  Filled 2017-10-17: qty 1

## 2017-10-17 MED ORDER — TRANEXAMIC ACID-NACL 1000-0.7 MG/100ML-% IV SOLN
1000.0000 mg | Freq: Once | INTRAVENOUS | Status: DC
  Filled 2017-10-17: qty 100

## 2017-10-17 MED ORDER — ACETAMINOPHEN 10 MG/ML IV SOLN
1000.0000 mg | Freq: Four times a day (QID) | INTRAVENOUS | Status: DC
  Administered 2017-10-17: 1000 mg via INTRAVENOUS
  Filled 2017-10-17: qty 100

## 2017-10-17 MED ORDER — TRAMADOL HCL 50 MG PO TABS: 50.0000 mg | ORAL_TABLET | Freq: Four times a day (QID) | ORAL | Status: DC | PRN

## 2017-10-17 MED ORDER — TRANEXAMIC ACID 1000 MG/10ML IV SOLN
1000.0000 mg | Freq: Once | INTRAVENOUS | Status: AC
  Administered 2017-10-17: 1000 mg via INTRAVENOUS
  Filled 2017-10-17: qty 1000

## 2017-10-17 MED ORDER — DEXAMETHASONE SODIUM PHOSPHATE 10 MG/ML IJ SOLN
INTRAMUSCULAR | Status: DC | PRN
  Administered 2017-10-17: 8 mg via INTRAVENOUS

## 2017-10-17 MED ORDER — METHOCARBAMOL 500 MG IVPB - SIMPLE MED
500.0000 mg | Freq: Four times a day (QID) | INTRAVENOUS | Status: DC | PRN
  Administered 2017-10-17: 500 mg via INTRAVENOUS
  Filled 2017-10-17: qty 50

## 2017-10-17 MED ORDER — METHOCARBAMOL 500 MG IVPB - SIMPLE MED
INTRAVENOUS | Status: AC
  Filled 2017-10-17: qty 50

## 2017-10-17 MED ORDER — ONDANSETRON HCL 4 MG/2ML IJ SOLN: 4.0000 mg | Freq: Four times a day (QID) | INTRAMUSCULAR | Status: DC | PRN

## 2017-10-17 MED ORDER — FENTANYL CITRATE (PF) 100 MCG/2ML IJ SOLN: INTRAMUSCULAR | Status: DC | PRN

## 2017-10-17 MED ORDER — FENTANYL CITRATE (PF) 100 MCG/2ML IJ SOLN
INTRAMUSCULAR | Status: AC
  Administered 2017-10-17: 25 ug via INTRAVENOUS
  Filled 2017-10-17: qty 2

## 2017-10-17 MED ORDER — DOCUSATE SODIUM 100 MG PO CAPS
100.0000 mg | ORAL_CAPSULE | Freq: Two times a day (BID) | ORAL | Status: DC
  Administered 2017-10-17 – 2017-10-18 (×2): 100 mg via ORAL
  Filled 2017-10-17 (×2): qty 1

## 2017-10-17 MED ORDER — PROPOFOL 10 MG/ML IV BOLUS
INTRAVENOUS | Status: AC
  Filled 2017-10-17: qty 60

## 2017-10-17 MED ORDER — DORZOLAMIDE HCL 2 % OP SOLN
1.0000 [drp] | Freq: Two times a day (BID) | OPHTHALMIC | Status: DC
  Administered 2017-10-17 – 2017-10-18 (×2): 1 [drp] via OPHTHALMIC
  Filled 2017-10-17: qty 10

## 2017-10-17 MED ORDER — BUPIVACAINE IN DEXTROSE 0.75-8.25 % IT SOLN
INTRATHECAL | Status: DC | PRN
  Administered 2017-10-17: 1.6 mL via INTRATHECAL

## 2017-10-17 MED ORDER — FENTANYL CITRATE (PF) 250 MCG/5ML IJ SOLN
INTRAMUSCULAR | Status: AC
  Filled 2017-10-17: qty 5

## 2017-10-17 MED ORDER — MENTHOL 3 MG MT LOZG: 1.0000 | LOZENGE | OROMUCOSAL | Status: DC | PRN

## 2017-10-17 MED ORDER — ONDANSETRON HCL 4 MG/2ML IJ SOLN: 4.0000 mg | Freq: Once | INTRAMUSCULAR | Status: DC | PRN

## 2017-10-17 MED ORDER — PROPOFOL 10 MG/ML IV BOLUS
INTRAVENOUS | Status: DC | PRN
  Administered 2017-10-17 (×5): 20 mg via INTRAVENOUS

## 2017-10-17 MED ORDER — SPIRONOLACTONE 25 MG PO TABS
25.0000 mg | ORAL_TABLET | Freq: Every evening | ORAL | Status: DC
  Administered 2017-10-17: 25 mg via ORAL
  Filled 2017-10-17: qty 1

## 2017-10-17 MED ORDER — DIPHENHYDRAMINE HCL 12.5 MG/5ML PO ELIX: 12.5000 mg | ORAL_SOLUTION | ORAL | Status: DC | PRN

## 2017-10-17 MED ORDER — LACTATED RINGERS IV SOLN
INTRAVENOUS | Status: DC
  Administered 2017-10-17 (×3): via INTRAVENOUS

## 2017-10-17 MED ORDER — METOCLOPRAMIDE HCL 5 MG/ML IJ SOLN: 5.0000 mg | Freq: Three times a day (TID) | INTRAMUSCULAR | Status: DC | PRN

## 2017-10-17 MED ORDER — FENTANYL CITRATE (PF) 100 MCG/2ML IJ SOLN
25.0000 ug | INTRAMUSCULAR | Status: DC | PRN
  Administered 2017-10-17 (×2): 25 ug via INTRAVENOUS

## 2017-10-17 MED ORDER — FENTANYL CITRATE (PF) 250 MCG/5ML IJ SOLN
INTRAMUSCULAR | Status: DC | PRN
  Administered 2017-10-17: 50 ug via INTRAVENOUS
  Administered 2017-10-17 (×4): 25 ug via INTRAVENOUS

## 2017-10-17 MED ORDER — CEFAZOLIN SODIUM-DEXTROSE 1-4 GM/50ML-% IV SOLN
1.0000 g | Freq: Four times a day (QID) | INTRAVENOUS | Status: AC
  Administered 2017-10-17 (×2): 1 g via INTRAVENOUS
  Filled 2017-10-17 (×2): qty 50

## 2017-10-17 MED ORDER — PROPOFOL 500 MG/50ML IV EMUL
INTRAVENOUS | Status: DC | PRN
  Administered 2017-10-17: 50 ug/kg/min via INTRAVENOUS

## 2017-10-17 MED ORDER — MIDAZOLAM HCL 2 MG/2ML IJ SOLN
INTRAMUSCULAR | Status: AC
  Filled 2017-10-17: qty 2

## 2017-10-17 MED ORDER — ACETAMINOPHEN 500 MG PO TABS
500.0000 mg | ORAL_TABLET | Freq: Four times a day (QID) | ORAL | Status: DC
  Administered 2017-10-17 (×2): 500 mg via ORAL
  Filled 2017-10-17 (×2): qty 1

## 2017-10-17 MED ORDER — TRAVOPROST 0.004 % OP SOLN: 1.0000 [drp] | Freq: Every day | OPHTHALMIC | Status: DC

## 2017-10-17 MED ORDER — 0.9 % SODIUM CHLORIDE (POUR BTL) OPTIME
TOPICAL | Status: DC | PRN
  Administered 2017-10-17: 1000 mL

## 2017-10-17 MED ORDER — SODIUM CHLORIDE 0.9 % IV SOLN
INTRAVENOUS | Status: DC
  Administered 2017-10-17 – 2017-10-18 (×2): via INTRAVENOUS

## 2017-10-17 MED ORDER — MORPHINE SULFATE (PF) 2 MG/ML IV SOLN: 0.5000 mg | INTRAVENOUS | Status: DC | PRN

## 2017-10-17 MED ORDER — LIDOCAINE 2% (20 MG/ML) 5 ML SYRINGE
INTRAMUSCULAR | Status: DC | PRN
  Administered 2017-10-17: 40 mg via INTRAVENOUS

## 2017-10-17 MED ORDER — LATANOPROST 0.005 % OP SOLN
1.0000 [drp] | Freq: Every day | OPHTHALMIC | Status: DC
  Administered 2017-10-17: 1 [drp] via OPHTHALMIC
  Filled 2017-10-17: qty 2.5

## 2017-10-17 MED ORDER — TRANEXAMIC ACID 1000 MG/10ML IV SOLN
1000.0000 mg | INTRAVENOUS | Status: AC
  Administered 2017-10-17: 1000 mg via INTRAVENOUS
  Filled 2017-10-17: qty 10

## 2017-10-17 MED ORDER — BUPIVACAINE HCL (PF) 0.25 % IJ SOLN
INTRAMUSCULAR | Status: AC
  Filled 2017-10-17: qty 30

## 2017-10-17 MED ORDER — METOCLOPRAMIDE HCL 5 MG PO TABS: 5.0000 mg | ORAL_TABLET | Freq: Three times a day (TID) | ORAL | Status: DC | PRN

## 2017-10-17 MED ORDER — CHLORHEXIDINE GLUCONATE 4 % EX LIQD: 60.0000 mL | Freq: Once | CUTANEOUS | Status: DC

## 2017-10-17 MED ORDER — MIDAZOLAM HCL 5 MG/5ML IJ SOLN
INTRAMUSCULAR | Status: DC | PRN
  Administered 2017-10-17 (×2): 1 mg via INTRAVENOUS

## 2017-10-17 MED ORDER — HYDROCODONE-ACETAMINOPHEN 5-325 MG PO TABS
1.0000 | ORAL_TABLET | ORAL | Status: DC | PRN
  Administered 2017-10-17: 2 via ORAL
  Administered 2017-10-18 (×3): 1 via ORAL
  Administered 2017-10-18: 2 via ORAL
  Filled 2017-10-17: qty 1
  Filled 2017-10-17: qty 2
  Filled 2017-10-17 (×2): qty 1
  Filled 2017-10-17: qty 2

## 2017-10-17 MED ORDER — SODIUM CHLORIDE 0.9 % IV SOLN
INTRAVENOUS | Status: DC | PRN
  Administered 2017-10-17: 25 ug/min via INTRAVENOUS

## 2017-10-17 MED ORDER — FLEET ENEMA 7-19 GM/118ML RE ENEM: 1.0000 | ENEMA | Freq: Once | RECTAL | Status: DC | PRN

## 2017-10-17 MED ORDER — PHENOL 1.4 % MT LIQD: 1.0000 | OROMUCOSAL | Status: DC | PRN

## 2017-10-17 MED ORDER — ONDANSETRON HCL 4 MG PO TABS: 4.0000 mg | ORAL_TABLET | Freq: Four times a day (QID) | ORAL | Status: DC | PRN

## 2017-10-17 MED ORDER — BUPIVACAINE HCL (PF) 0.25 % IJ SOLN
INTRAMUSCULAR | Status: DC | PRN
  Administered 2017-10-17: 30 mL

## 2017-10-17 MED ORDER — STERILE WATER FOR IRRIGATION IR SOLN
Status: DC | PRN
  Administered 2017-10-17: 2000 mL

## 2017-10-17 MED ORDER — CEFAZOLIN SODIUM-DEXTROSE 2-4 GM/100ML-% IV SOLN
2.0000 g | INTRAVENOUS | Status: AC
  Administered 2017-10-17: 2 g via INTRAVENOUS
  Filled 2017-10-17: qty 100

## 2017-10-17 MED ORDER — BISACODYL 10 MG RE SUPP: 10.0000 mg | Freq: Every day | RECTAL | Status: DC | PRN

## 2017-10-17 MED ORDER — POLYETHYLENE GLYCOL 3350 17 G PO PACK: 17.0000 g | PACK | Freq: Every day | ORAL | Status: DC | PRN

## 2017-10-17 MED ORDER — UBIQUINOL 100 MG PO CAPS: 100.0000 mg | ORAL_CAPSULE | Freq: Every day | ORAL | Status: DC

## 2017-10-17 MED ORDER — DEXAMETHASONE SODIUM PHOSPHATE 10 MG/ML IJ SOLN: 8.0000 mg | Freq: Once | INTRAMUSCULAR | Status: DC

## 2017-10-17 MED ORDER — METHOCARBAMOL 500 MG PO TABS
500.0000 mg | ORAL_TABLET | Freq: Four times a day (QID) | ORAL | Status: DC | PRN
  Administered 2017-10-18: 500 mg via ORAL
  Filled 2017-10-17: qty 1

## 2017-10-17 SURGICAL SUPPLY — 52 items
BAG DECANTER FOR FLEXI CONT (MISCELLANEOUS) ×1 IMPLANT
BAG SPEC THK2 15X12 ZIP CLS (MISCELLANEOUS)
BAG ZIPLOCK 12X15 (MISCELLANEOUS) IMPLANT
BALL HIP ARTICU 28 +5 (Hips) IMPLANT
BLADE SAG 18X100X1.27 (BLADE) ×3 IMPLANT
CLOSURE WOUND 1/2 X4 (GAUZE/BANDAGES/DRESSINGS) ×1
COVER PERINEAL POST (MISCELLANEOUS) ×3 IMPLANT
COVER SURGICAL LIGHT HANDLE (MISCELLANEOUS) ×3 IMPLANT
COVER WAND RF STERILE (DRAPES) ×2 IMPLANT
CUP ACETBLR 48 OD SECTOR II (Hips) ×2 IMPLANT
DECANTER SPIKE VIAL GLASS SM (MISCELLANEOUS) ×3 IMPLANT
DRAPE STERI IOBAN 125X83 (DRAPES) ×3 IMPLANT
DRAPE U-SHAPE 47X51 STRL (DRAPES) ×6 IMPLANT
DRSG ADAPTIC 3X8 NADH LF (GAUZE/BANDAGES/DRESSINGS) ×3 IMPLANT
DRSG MEPILEX BORDER 4X4 (GAUZE/BANDAGES/DRESSINGS) ×3 IMPLANT
DRSG MEPILEX BORDER 4X8 (GAUZE/BANDAGES/DRESSINGS) ×3 IMPLANT
DURAPREP 26ML APPLICATOR (WOUND CARE) ×3 IMPLANT
ELECT REM PT RETURN 15FT ADLT (MISCELLANEOUS) ×3 IMPLANT
EVACUATOR 1/8 PVC DRAIN (DRAIN) ×3 IMPLANT
GLOVE BIO SURGEON STRL SZ7 (GLOVE) ×1 IMPLANT
GLOVE BIO SURGEON STRL SZ8 (GLOVE) ×1 IMPLANT
GLOVE BIOGEL PI IND STRL 6.5 (GLOVE) IMPLANT
GLOVE BIOGEL PI IND STRL 7.0 (GLOVE) ×1 IMPLANT
GLOVE BIOGEL PI IND STRL 7.5 (GLOVE) IMPLANT
GLOVE BIOGEL PI IND STRL 8 (GLOVE) ×1 IMPLANT
GLOVE BIOGEL PI INDICATOR 6.5 (GLOVE) ×4
GLOVE BIOGEL PI INDICATOR 7.0 (GLOVE) ×2
GLOVE BIOGEL PI INDICATOR 7.5 (GLOVE) ×8
GLOVE BIOGEL PI INDICATOR 8 (GLOVE) ×2
GLOVE SURG SS PI 6.5 STRL IVOR (GLOVE) ×4 IMPLANT
GLOVE SURG SS PI 7.0 STRL IVOR (GLOVE) ×2 IMPLANT
GLOVE SURG SS PI 8.0 STRL IVOR (GLOVE) ×2 IMPLANT
GOWN STRL REUS W/TWL LRG LVL3 (GOWN DISPOSABLE) ×3 IMPLANT
GOWN STRL REUS W/TWL LRG LVL4 (GOWN DISPOSABLE) ×6 IMPLANT
GOWN STRL REUS W/TWL XL LVL3 (GOWN DISPOSABLE) ×5 IMPLANT
HIP BALL ARTICU 28 +5 (Hips) ×3 IMPLANT
HOLDER FOLEY CATH W/STRAP (MISCELLANEOUS) ×3 IMPLANT
LINER MARATHON 28 48 (Hips) ×2 IMPLANT
MANIFOLD NEPTUNE II (INSTRUMENTS) ×3 IMPLANT
PACK ANTERIOR HIP CUSTOM (KITS) ×3 IMPLANT
STEM FEM ACTIS HIGH SZ3 (Stem) ×2 IMPLANT
STRIP CLOSURE SKIN 1/2X4 (GAUZE/BANDAGES/DRESSINGS) ×2 IMPLANT
SUT ETHIBOND NAB CT1 #1 30IN (SUTURE) ×3 IMPLANT
SUT MNCRL AB 4-0 PS2 18 (SUTURE) ×3 IMPLANT
SUT STRATAFIX 0 PDS 27 VIOLET (SUTURE) ×3
SUT VIC AB 2-0 CT1 27 (SUTURE) ×6
SUT VIC AB 2-0 CT1 TAPERPNT 27 (SUTURE) ×2 IMPLANT
SUTURE STRATFX 0 PDS 27 VIOLET (SUTURE) ×1 IMPLANT
SYR 50ML LL SCALE MARK (SYRINGE) IMPLANT
TRAY FOLEY CATH 14FRSI W/METER (CATHETERS) ×2 IMPLANT
TRAY FOLEY MTR SLVR 16FR STAT (SET/KITS/TRAYS/PACK) ×1 IMPLANT
YANKAUER SUCT BULB TIP 10FT TU (MISCELLANEOUS) ×3 IMPLANT

## 2017-10-17 NOTE — Op Note (Signed)
OPERATIVE REPORT- TOTAL HIP ARTHROPLASTY   PREOPERATIVE DIAGNOSIS: Osteoarthritis of the Left hip.   POSTOPERATIVE DIAGNOSIS: Osteoarthritis of the Left  hip.   PROCEDURE: Left total hip arthroplasty, anterior approach.   SURGEON: [name], MD   ASSISTANT: Ardeen Jourdain, PA-C  ANESTHESIA:  Spinal  ESTIMATED BLOOD LOSS:-500 mL    DRAINS: Hemovac x1.   COMPLICATIONS: None   CONDITION: PACU - hemodynamically stable.   BRIEF CLINICAL NOTE: [name] [name] is a 77 y.o. female who has advanced end-  stage arthritis of their Left hip with progressively worsening pain and  dysfunction.The patient has failed nonoperative management and presents for  total hip arthroplasty.   PROCEDURE IN DETAIL: After successful administration of spinal  anesthetic, the traction boots for the [facility] bed were placed on both  feet and the patient was placed onto the [facility] bed, boots placed into the leg  holders. The Left hip was then isolated from the perineum with plastic  drapes and prepped and draped in the usual sterile fashion. ASIS and  greater trochanter were marked and a oblique incision was made, starting  at about 1 cm lateral and 2 cm distal to the ASIS and coursing towards  the anterior cortex of the femur. The skin was cut with a 10 blade  through subcutaneous tissue to the level of the fascia overlying the  tensor fascia lata muscle. The fascia was then incised in line with the  incision at the junction of the anterior third and posterior 2/3rd. The  muscle was teased off the fascia and then the interval between the TFL  and the rectus was developed. The Hohmann retractor was then placed at  the top of the femoral neck over the capsule. The vessels overlying the  capsule were cauterized and the fat on top of the capsule was removed.  A Hohmann retractor was then placed anterior underneath the rectus  femoris to give exposure to the entire anterior capsule. A T-shaped   capsulotomy was performed. The edges were tagged and the femoral head  was identified.       Osteophytes are removed off the superior acetabulum.  The femoral neck was then cut in situ with an oscillating saw. Traction  was then applied to the left lower extremity utilizing the [facility]  traction. The femoral head was then removed. Retractors were placed  around the acetabulum and then circumferential removal of the labrum was  performed. Osteophytes were also removed. Reaming starts at 45 mm to  medialize and  Increased in 2 mm increments to 47 mm. We reamed in  approximately 40 degrees of abduction, 20 degrees anteversion. A 48 mm  pinnacle acetabular shell was then impacted in anatomic position under  fluoroscopic guidance with excellent purchase. We did not need to place  any additional dome screws. A 28 mm neutral + 4 marathon liner was then  placed into the acetabular shell.       The femoral lift was then placed along the lateral aspect of the femur  just distal to the vastus ridge. The leg was  externally rotated and capsule  was stripped off the inferior aspect of the femoral neck down to the  level of the lesser trochanter, this was done with electrocautery. The femur was lifted after this was performed. The  leg was then placed in an extended and adducted position essentially delivering the femur. We also removed the capsule superiorly and the piriformis from the piriformis fossa  to gain excellent exposure of the  proximal femur. Rongeur was used to remove some cancellous bone to get  into the lateral portion of the proximal femur for placement of the  initial starter reamer. The starter broaches was placed  the starter broach  and was shown to go down the center of the canal. Broaching  with the Actis system was then performed starting at size 0  coursing  Up to size 3. A size 3 had excellent torsional and rotational  and axial stability. The trial high offset neck was then placed   with a 28 + 5 trial head. The hip was then reduced. We confirmed that  the stem was in the canal both on AP and lateral x-rays. It also has excellent sizing. The hip was reduced with outstanding stability through full extension and full external rotation.. AP pelvis was taken and the leg lengths were measured and found to be equal. Hip was then dislocated again and the femoral head and neck removed. The  femoral broach was removed. Size 3 Actis stem with a high offset  neck was then impacted into the femur following native anteversion. Has  excellent purchase in the canal. Excellent torsional and rotational and  axial stability. It is confirmed to be in the canal on AP and lateral  fluoroscopic views. The 28 + 5 metal head was placed and the hip  reduced with outstanding stability. Again AP pelvis was taken and it  confirmed that the leg lengths were equal. The wound was then copiously  irrigated with saline solution and the capsule reattached and repaired  with Ethibond suture. 30 ml of .25% Bupivicaine was  injected into the capsule and into the edge of the tensor fascia lata as well as subcutaneous tissue. The fascia overlying the tensor fascia lata was then closed with a running #1 V-Loc. Subcu was closed with interrupted 2-0 Vicryl and subcuticular running 4-0 Monocryl. Incision was cleaned  and dried. Steri-Strips and a bulky sterile dressing applied. Hemovac  drain was hooked to suction and then the patient was awakened and transported to  recovery in stable condition.        Please note that a surgical assistant was a medical necessity for this procedure to perform it in a safe and expeditious manner. Assistant was necessary to provide appropriate retraction of vital neurovascular structures and to prevent femoral fracture and allow for anatomic placement of the prosthesis.  [name], M.D.

## 2017-10-17 NOTE — Anesthesia Procedure Notes (Signed)
Spinal  Patient location during procedure: OR Start time: 10/17/2017 10:59 AM End time: 10/17/2017 11:03 AM Reason for block: at surgeon's request Staffing Resident/CRNA: Anne Fu, CRNA Performed: resident/CRNA  Preanesthetic Checklist Completed: patient identified, site marked, surgical consent, pre-op evaluation, timeout performed, IV checked, risks and benefits discussed and monitors and equipment checked Spinal Block Patient position: sitting Prep: DuraPrep Patient monitoring: heart rate, continuous pulse ox and blood pressure Approach: right paramedian Location: L2-3 Injection technique: single-shot Needle Needle type: Pencan  Needle gauge: 24 G Needle length: 9 cm Assessment Sensory level: T6 Additional Notes  Functioning IV was confirmed and monitors were applied. Expiration date of kit checked and confirmed. Sterile prep and drape, including hand hygiene and sterile gloves were used. The patient was positioned and the spine was prepped. The skin was anesthetized with lidocaine.  Free flow of clear CSF was obtained prior to injecting local anesthetic into the CSF X 1 attempt.  The spinal needle aspirated freely following injection.  The needle was carefully withdrawn. Patient tolerated procedure well, without complications. Loss of motor and sensory on exam post injection.

## 2017-10-17 NOTE — Progress Notes (Signed)
PHARMACIST - PHYSICIAN ORDER COMMUNICATION  CONCERNING: P&T Medication Policy on Herbal Medications  DESCRIPTION:  This patient's order for:  Ubiquinol  has been noted.  This product(s) is classified as an "herbal" or natural product. Due to a lack of definitive safety studies or FDA approval, nonstandard manufacturing practices, plus the potential risk of unknown drug-drug interactions while on inpatient medications, the Pharmacy and Therapeutics Committee does not permit the use of "herbal" or natural products of this type within [facility].   ACTION TAKEN: The pharmacy department is unable to verify this order at this time and your patient has been informed of this safety policy. Please reevaluate patient's clinical condition at discharge and address if the herbal or natural product(s) should be resumed at that time.  Netta Cedars, PharmD, BCPS 10/17/2017@3 :28 PM

## 2017-10-17 NOTE — Evaluation (Signed)
Physical Therapy Evaluation Patient Details Name: [name] [name] MRN: [id number] DOB: 12/04/1940 Today's Date: 10/17/2017   History of Present Illness  77 YO female s/p L DA-THA on 10/17/17. PMH includes glaucoma, edema, OA, IBS, osteopenia, cataracts.   Clinical Impression  Pt with L hip pain especially during ambulation/turning, difficulty performing mobility tasks, and decreased tolerance for ambulation due to L hip pain. Acute PT to address deficits. Pt ambulated 68 ft with RW with min guard assist today. PT to continue to follow and will progress mobility as able.     Follow Up Recommendations Follow surgeon's recommendation for DC plan and follow-up therapies;Supervision for mobility/OOB(HEP)    Equipment Recommendations  None recommended by PT    Recommendations for Other Services       Precautions / Restrictions Precautions Precautions: Fall Restrictions Weight Bearing Restrictions: No Other Position/Activity Restrictions: WBAT       Mobility  Bed Mobility Overal bed mobility: Needs Assistance Bed Mobility: Supine to Sit     Supine to sit: Min assist;HOB elevated     General bed mobility comments: Min assist for LLE management, scooting to EOB. verbal cuing for sequencing, increased time and effort to perform.   Transfers Overall transfer level: Needs assistance Equipment used: Rolling walker (2 wheeled) Transfers: Sit to/from Stand Sit to Stand: Min guard;From elevated surface         General transfer comment: Min guard for safety. Verbal cuing for hand placement. Pt weight shifted L and R without difficulty   Ambulation/Gait Ambulation/Gait assistance: Min guard Gait Distance (Feet): 70 Feet Assistive device: Rolling walker (2 wheeled) Gait Pattern/deviations: Step-to pattern;Decreased stride length;Trunk flexed;Antalgic;Decreased stance time - left;Decreased weight shift to left Gait velocity: decr    General Gait Details: Min guard for safety,  verbal cuing for sequencing and placement in RW. Pt with increased hip pain with turning, pt turned toward R per PT instruction and said it felt sore with changes in direction. Pt with lessening pain on ambulation back to room.   Stairs            Wheelchair Mobility    Modified Rankin (Stroke Patients Only)       Balance Overall balance assessment: Mild deficits observed, not formally tested                                           Pertinent Vitals/Pain Pain Assessment: 0-10 Pain Score: 4  Pain Location: L hip  Pain Descriptors / Indicators: Aching Pain Intervention(s): Limited activity within patient's tolerance;Repositioned;Ice applied;Monitored during session;Premedicated before session    Home Living Family/patient expects to be discharged to:: Private residence Living Arrangements: Spouse/significant other Available Help at Discharge: Family;Available PRN/intermittently Type of Home: House Home Access: Stairs to enter Entrance Stairs-Rails: Left Entrance Stairs-Number of Steps: 5 Home Layout: Two level;1/2 bath on main level Home Equipment: Walker - 2 wheels;Cane - single point;Grab bars - tub/shower      Prior Function Level of Independence: Independent with assistive device(s)         Comments: using cane to ambulate prior to surgery      Hand Dominance   Dominant Hand: Right    Extremity/Trunk Assessment   Upper Extremity Assessment Upper Extremity Assessment: Overall WFL for tasks assessed    Lower Extremity Assessment Lower Extremity Assessment: Overall WFL for tasks assessed;LLE deficits/detail LLE Deficits / Details: Suspected post-surgical hip  weakness; able to perform quad set, SLR with assist, ankle pumps  LLE Sensation: WNL    Cervical / Trunk Assessment Cervical / Trunk Assessment: Normal  Communication   Communication: No difficulties  Cognition Arousal/Alertness: Awake/alert Behavior During Therapy: WFL for  tasks assessed/performed Overall Cognitive Status: Within Functional Limits for tasks assessed                                        General Comments      Exercises Total Joint Exercises Ankle Circles/Pumps: AROM;Both;10 reps;Supine   Assessment/Plan    PT Assessment Patient needs continued PT services  PT Problem List Decreased strength;Pain;Decreased range of motion;Decreased activity tolerance;Decreased knowledge of use of DME;Decreased balance;Decreased mobility;Decreased safety awareness       PT Treatment Interventions Therapeutic activities;DME instruction;Gait training;Therapeutic exercise;Patient/family education;Stair training;Balance training;Functional mobility training    PT Goals (Current goals can be found in the Care Plan section)  Acute Rehab PT Goals Patient Stated Goal: improve mobility  PT Goal Formulation: With patient Time For Goal Achievement: 10/31/17 Potential to Achieve Goals: Good    Frequency 7X/week   Barriers to discharge        Co-evaluation               AM-PAC PT "6 Clicks" Daily Activity  Outcome Measure Difficulty turning over in bed (including adjusting bedclothes, sheets and blankets)?: Unable Difficulty moving from lying on back to sitting on the side of the bed? : Unable Difficulty sitting down on and standing up from a chair with arms (e.g., wheelchair, bedside commode, etc,.)?: Unable Help needed moving to and from a bed to chair (including a wheelchair)?: A Little Help needed walking in hospital room?: A Little Help needed climbing 3-5 steps with a railing? : A Little 6 Click Score: 12    End of Session Equipment Utilized During Treatment: Gait belt Activity Tolerance: Patient tolerated treatment well;Patient limited by pain Patient left: in chair;with chair alarm set;with call bell/phone within reach;with SCD's reapplied Nurse Communication: Mobility status PT Visit Diagnosis: Other abnormalities of  gait and mobility (R26.89);Difficulty in walking, not elsewhere classified (R26.2)    Time: 1730-1759 PT Time Calculation (min) (ACUTE ONLY): 29 min   Charges:   PT Evaluation $PT Eval Low Complexity: 1 Low PT Treatments $Gait Training: 8-22 mins        Julien Girt, PT Acute Rehabilitation Services Pager [phone]  Office [phone]  Avram Danielson D Jkwon Treptow 10/17/2017, 8:26 PM

## 2017-10-17 NOTE — Transfer of Care (Signed)
Immediate Anesthesia Transfer of Care Note  Patient: [name] [name]  Procedure(s) Performed: LEFT TOTAL HIP ARTHROPLASTY ANTERIOR APPROACH (Left Hip)  Patient Location: PACU  Anesthesia Type:Spinal  Level of Consciousness: awake and alert   Airway & Oxygen Therapy: Patient Spontanous Breathing and Patient connected to face mask oxygen  Post-op Assessment: Report given to RN and Post -op Vital signs reviewed and stable  Post vital signs: Reviewed and stable  Last Vitals:  Vitals Value Taken Time  BP 96/54 10/17/2017 12:47 PM  Temp    Pulse 81 10/17/2017 12:49 PM  Resp 10 10/17/2017 12:49 PM  SpO2 99 % 10/17/2017 12:49 PM  Vitals shown include unvalidated device data.  Last Pain:  Vitals:   10/17/17 0852  TempSrc: Oral  PainSc: 0-No pain         Complications: No apparent anesthesia complications

## 2017-10-17 NOTE — Interval H&P Note (Signed)
History and Physical Interval Note:  10/17/2017 9:53 AM  [name] [name]  has presented today for surgery, with the diagnosis of left hip osteoarthritis  The various methods of treatment have been discussed with the patient and family. After consideration of risks, benefits and other options for treatment, the patient has consented to  Procedure(s): LEFT TOTAL HIP ARTHROPLASTY ANTERIOR APPROACH (Left) as a surgical intervention .  The patient's history has been reviewed, patient examined, no change in status, stable for surgery.  I have reviewed the patient's chart and labs.  Questions were answered to the patient's satisfaction.     Pilar Plate Jaysen Wey

## 2017-10-17 NOTE — Anesthesia Postprocedure Evaluation (Signed)
Anesthesia Post Note  Patient: [name] [name]  Procedure(s) Performed: LEFT TOTAL HIP ARTHROPLASTY ANTERIOR APPROACH (Left Hip)     Patient location during evaluation: PACU Anesthesia Type: Spinal Level of consciousness: oriented, awake and alert and awake Pain management: pain level controlled Vital Signs Assessment: post-procedure vital signs reviewed and stable Respiratory status: spontaneous breathing, respiratory function stable and nonlabored ventilation Cardiovascular status: blood pressure returned to baseline and stable Postop Assessment: no headache, no backache, no apparent nausea or vomiting, patient able to bend at knees and spinal receding Anesthetic complications: no    Last Vitals:  Vitals:   10/17/17 1345 10/17/17 1400  BP: 135/77 128/72  Pulse: 65 64  Resp: 13 11  Temp:    SpO2: 100% 100%    Last Pain:  Vitals:   10/17/17 1415  TempSrc:   PainSc: [place]

## 2017-10-17 NOTE — Anesthesia Preprocedure Evaluation (Addendum)
Anesthesia Evaluation  Patient identified by MRN, date of birth, ID band Patient awake    Reviewed: Allergy & Precautions, NPO status , Patient's Chart, lab work & pertinent test results  History of Anesthesia Complications (+) DIFFICULT AIRWAY and history of anesthetic complications  Airway Mallampati: II  TM Distance: <3 FB Neck ROM: Full    Dental  (+) Teeth Intact, Dental Advisory Given, Caps,    Pulmonary former smoker,  H/o tuberculosis   Pulmonary exam normal breath sounds clear to auscultation       Cardiovascular Exercise Tolerance: Good negative cardio ROS   Rhythm:Regular Rate:Normal + Systolic murmurs    Neuro/Psych negative neurological ROS  negative psych ROS   GI/Hepatic GERD  ,Gilbert's syndrome- coags normal   Endo/Other  negative endocrine ROS  Renal/GU negative Renal ROS     Musculoskeletal  (+) Arthritis ,   Abdominal   Peds  Hematology negative hematology ROS (+) Plt 237k   Anesthesia Other Findings Day of surgery medications reviewed with the patient.  Reproductive/Obstetrics                             Anesthesia Physical  Anesthesia Plan  ASA: II  Anesthesia Plan: Spinal   Post-op Pain Management:    Induction:   PONV Risk Score and Plan: 2 and Propofol infusion, Treatment may vary due to age or medical condition and Ondansetron  Airway Management Planned: Natural Airway and Simple Face Mask  Additional Equipment:   Intra-op Plan:   Post-operative Plan:   Informed Consent: I have reviewed the patients History and Physical, chart, labs and discussed the procedure including the risks, benefits and alternatives for the proposed anesthesia with the patient or authorized representative who has indicated his/her understanding and acceptance.   Dental advisory given  Plan Discussed with: CRNA, Anesthesiologist and Surgeon  Anesthesia Plan Comments:          Anesthesia Quick Evaluation  

## 2017-10-17 NOTE — Anesthesia Procedure Notes (Signed)
Procedure Name: MAC Date/Time: 10/17/2017 11:00 AM Performed by: Cynda Familia, CRNA Pre-anesthesia Checklist: Patient identified, Suction available, Emergency Drugs available, Patient being monitored and Timeout performed Patient Re-evaluated:Patient Re-evaluated prior to induction Oxygen Delivery Method: Simple face mask Preoxygenation: Pre-oxygenation with 100% oxygen Placement Confirmation: positive ETCO2 and breath sounds checked- equal and bilateral Dental Injury: Teeth and Oropharynx as per pre-operative assessment  Comments: Sedation for spinal

## 2017-10-17 NOTE — Discharge Instructions (Addendum)
°Dr. Frank Aluisio °Total Joint Specialist °[facility] °[address] °[place], [place] [address] °[phone] ° °ANTERIOR APPROACH TOTAL HIP REPLACEMENT POSTOPERATIVE DIRECTIONS ° ° °Hip Rehabilitation, Guidelines Following Surgery  °The results of a hip operation are greatly improved after range of motion and muscle strengthening exercises. Follow all safety measures which are given to protect your hip. If any of these exercises cause increased pain or swelling in your joint, decrease the amount until you are comfortable again. Then slowly increase the exercises. Call your caregiver if you have problems or questions.  ° °HOME CARE INSTRUCTIONS  °• Remove items at home which could result in a fall. This includes throw rugs or furniture in walking pathways.  °· ICE to the affected hip every three hours for 30 minutes at a time and then as needed for pain and swelling.  Continue to use ice on the hip for pain and swelling from surgery. You may notice swelling that will progress down to the foot and ankle.  This is normal after surgery.  Elevate the leg when you are not up walking on it.   °· Continue to use the breathing machine which will help keep your temperature down.  It is common for your temperature to cycle up and down following surgery, especially at night when you are not up moving around and exerting yourself.  The breathing machine keeps your lungs expanded and your temperature down. ° °DIET °You may resume your previous home diet once your are discharged from the hospital. ° °DRESSING / WOUND CARE / SHOWERING °You may shower 3 days after surgery, but keep the wounds dry during showering.  You may use an occlusive plastic wrap (Press'n Seal for example), NO SOAKING/SUBMERGING IN THE BATHTUB.  If the bandage gets wet, change with a clean dry gauze.  If the incision gets wet, pat the wound dry with a clean towel. °You may start showering once you are discharged home but do not submerge the  incision under water. Just pat the incision dry and apply a dry gauze dressing on daily. °Change the surgical dressing daily and reapply a dry dressing each time. ° °ACTIVITY °Walk with your walker as instructed. °Use walker as long as suggested by your caregivers. °Avoid periods of inactivity such as sitting longer than an hour when not asleep. This helps prevent blood clots.  °You may resume a sexual relationship in one month or when given the OK by your doctor.  °You may return to work once you are cleared by your doctor.  °Do not drive a car for 6 weeks or until released by you surgeon.  °Do not drive while taking narcotics. ° °WEIGHT BEARING °Weight bearing as tolerated with assist device (walker, cane, etc) as directed, use it as long as suggested by your surgeon or therapist, typically at least 4-6 weeks. ° °POSTOPERATIVE CONSTIPATION PROTOCOL °Constipation - defined medically as fewer than three stools per week and severe constipation as less than one stool per week. ° °One of the most common issues patients have following surgery is constipation.  Even if you have a regular bowel pattern at home, your normal regimen is likely to be disrupted due to multiple reasons following surgery.  Combination of anesthesia, postoperative narcotics, change in appetite and fluid intake all can affect your bowels.  In order to avoid complications following surgery, here are some recommendations in order to help you during your recovery period. ° °Colace (docusate) - Pick up an over-the-counter form   of Colace or another stool softener and take twice a day as long as you are requiring postoperative pain medications.  Take with a full glass of water daily.  If you experience loose stools or diarrhea, hold the colace until you stool forms back up.  If your symptoms do not get better within 1 week or if they get worse, check with your doctor. ° °Dulcolax (bisacodyl) - Pick up over-the-counter and take as directed by the product  packaging as needed to assist with the movement of your bowels.  Take with a full glass of water.  Use this product as needed if not relieved by Colace only.  ° °MiraLax (polyethylene glycol) - Pick up over-the-counter to have on hand.  MiraLax is a solution that will increase the amount of water in your bowels to assist with bowel movements.  Take as directed and can mix with a glass of water, juice, soda, coffee, or tea.  Take if you go more than two days without a movement. °Do not use MiraLax more than once per day. Call your doctor if you are still constipated or irregular after using this medication for 7 days in a row. ° °If you continue to have problems with postoperative constipation, please contact the office for further assistance and recommendations.  If you experience "the worst abdominal pain ever" or develop nausea or vomiting, please contact the office immediatly for further recommendations for treatment. ° °ITCHING ° If you experience itching with your medications, try taking only a single pain pill, or even half a pain pill at a time.  You can also use Benadryl over the counter for itching or also to help with sleep.  ° °TED HOSE STOCKINGS °Wear the elastic stockings on both legs for three weeks following surgery during the day but you may remove then at night for sleeping. ° °MEDICATIONS °See your medication summary on the “After Visit Summary” that the nursing staff will review with you prior to discharge.  You may have some home medications which will be placed on hold until you complete the course of blood thinner medication.  It is important for you to complete the blood thinner medication as prescribed by your surgeon.  Continue your approved medications as instructed at time of discharge. ° °PRECAUTIONS °If you experience chest pain or shortness of breath - call 911 immediately for transfer to the hospital emergency department.  °If you develop a fever greater that 101 F, purulent drainage  from wound, increased redness or drainage from wound, foul odor from the wound/dressing, or calf pain - CONTACT YOUR SURGEON.   °                                                °FOLLOW-UP APPOINTMENTS °Make sure you keep all of your appointments after your operation with your surgeon and caregivers. You should call the office at the above phone number and make an appointment for approximately two weeks after the date of your surgery or on the date instructed by your surgeon outlined in the "After Visit Summary". ° °RANGE OF MOTION AND STRENGTHENING EXERCISES  °These exercises are designed to help you keep full movement of your hip joint. Follow your caregiver's or physical therapist's instructions. Perform all exercises about fifteen times, three times per day or as directed. Exercise both hips, even if you have   had only one joint replacement. These exercises can be done on a training (exercise) mat, on the floor, on a table or on a bed. Use whatever works the best and is most comfortable for you. Use music or television while you are exercising so that the exercises are a pleasant break in your day. This will make your life better with the exercises acting as a break in routine you can look forward to.  °• Lying on your back, slowly slide your foot toward your buttocks, raising your knee up off the floor. Then slowly slide your foot back down until your leg is straight again.  °• Lying on your back spread your legs as far apart as you can without causing discomfort.  °• Lying on your side, raise your upper leg and foot straight up from the floor as far as is comfortable. Slowly lower the leg and repeat.  °• Lying on your back, tighten up the muscle in the front of your thigh (quadriceps muscles). You can do this by keeping your leg straight and trying to raise your heel off the floor. This helps strengthen the largest muscle supporting your knee.  °• Lying on your back, tighten up the muscles of your buttocks both  with the legs straight and with the knee bent at a comfortable angle while keeping your heel on the floor.  ° °IF YOU ARE TRANSFERRED TO A SKILLED REHAB FACILITY °If the patient is transferred to a skilled rehab facility following release from the hospital, a list of the current medications will be sent to the facility for the patient to continue.  When discharged from the skilled rehab facility, please have the facility set up the patient's Home Health Physical Therapy prior to being released. Also, the skilled facility will be responsible for providing the patient with their medications at time of release from the facility to include their pain medication, the muscle relaxants, and their blood thinner medication. If the patient is still at the rehab facility at time of the two week follow up appointment, the skilled rehab facility will also need to assist the patient in arranging follow up appointment in our office and any transportation needs. ° °MAKE SURE YOU:  °• Understand these instructions.  °• Get help right away if you are not doing well or get worse.  ° ° °Pick up stool softner and laxative for home use following surgery while on pain medications. °Do not submerge incision under water. °Please use good hand washing techniques while changing dressing each day. °May shower starting three days after surgery. °Please use a clean towel to pat the incision dry following showers. °Continue to use ice for pain and swelling after surgery. °Do not use any lotions or creams on the incision until instructed by your surgeon. ° °Information on my medicine - XARELTO® (Rivaroxaban) ° °Why was Xarelto® prescribed for you? °Xarelto® was prescribed for you to reduce the risk of blood clots forming after orthopedic surgery. The medical term for these abnormal blood clots is venous thromboembolism (VTE). ° °What do you need to know about xarelto® ? °Take your Xarelto® ONCE DAILY at the same time every day. °You may take it  either with or without food. ° °If you have difficulty swallowing the tablet whole, you may crush it and mix in applesauce just prior to taking your dose. ° °Take Xarelto® exactly as prescribed by your doctor and DO NOT stop taking Xarelto® without talking to the doctor who prescribed the medication.    Stopping without other VTE prevention medication to take the place of Xarelto® may increase your risk of developing a clot. ° °After discharge, you should have regular check-up appointments with your healthcare provider that is prescribing your Xarelto®.   ° °What do you do if you miss a dose? °If you miss a dose, take it as soon as you remember on the same day then continue your regularly scheduled once daily regimen the next day. Do not take two doses of Xarelto® on the same day.  ° °Important Safety Information °A possible side effect of Xarelto® is bleeding. You should call your healthcare provider right away if you experience any of the following: °? Bleeding from an injury or your nose that does not stop. °? Unusual colored urine (red or dark brown) or unusual colored stools (red or black). °? Unusual bruising for unknown reasons. °? A serious fall or if you hit your head (even if there is no bleeding). ° °Some medicines may interact with Xarelto® and might increase your risk of bleeding while on Xarelto®. To help avoid this, consult your healthcare provider or pharmacist prior to using any new prescription or non-prescription medications, including herbals, vitamins, non-steroidal anti-inflammatory drugs (NSAIDs) and supplements. ° °This website has more information on Xarelto®: www.xarelto.com. ° ° ° °

## 2017-10-18 ENCOUNTER — Encounter (HOSPITAL_COMMUNITY): Payer: Self-pay | Admitting: Orthopedic Surgery

## 2017-10-18 LAB — CBC
HEMATOCRIT: 36.8 % (ref 36.0–46.0)
Hemoglobin: 11.6 g/dL — ABNORMAL LOW (ref 12.0–15.0)
MCH: 31.2 pg (ref 26.0–34.0)
MCHC: 31.5 g/dL (ref 30.0–36.0)
MCV: 98.9 fL (ref 80.0–100.0)
NRBC: 0 % (ref 0.0–0.2)
PLATELETS: 210 10*3/uL (ref 150–400)
RBC: 3.72 MIL/uL — ABNORMAL LOW (ref 3.87–5.11)
RDW: 13 % (ref 11.5–15.5)
WBC: 10.2 10*3/uL (ref 4.0–10.5)

## 2017-10-18 LAB — BASIC METABOLIC PANEL
ANION GAP: 8 (ref 5–15)
BUN: 13 mg/dL (ref 8–23)
CALCIUM: 8.6 mg/dL — AB (ref 8.9–10.3)
CO2: 24 mmol/L (ref 22–32)
Chloride: 108 mmol/L (ref 98–111)
Creatinine, Ser: 0.76 mg/dL (ref 0.44–1.00)
GLUCOSE: 128 mg/dL — AB (ref 70–99)
POTASSIUM: 3.8 mmol/L (ref 3.5–5.1)
Sodium: 140 mmol/L (ref 135–145)

## 2017-10-18 MED ORDER — RIVAROXABAN 10 MG PO TABS: 10.0000 mg | ORAL_TABLET | Freq: Every day | ORAL | 0 refills | Status: DC

## 2017-10-18 MED ORDER — HYDROCODONE-ACETAMINOPHEN 5-325 MG PO TABS: 1.0000 | ORAL_TABLET | Freq: Four times a day (QID) | ORAL | 0 refills | Status: DC | PRN

## 2017-10-18 MED ORDER — METHOCARBAMOL 500 MG PO TABS: 500.0000 mg | ORAL_TABLET | Freq: Four times a day (QID) | ORAL | 0 refills | Status: DC | PRN

## 2017-10-18 MED ORDER — TRAMADOL HCL 50 MG PO TABS: 50.0000 mg | ORAL_TABLET | Freq: Four times a day (QID) | ORAL | 0 refills | Status: DC | PRN

## 2017-10-18 NOTE — Progress Notes (Signed)
   Subjective: 1 Day Post-Op Procedure(s) (LRB): LEFT TOTAL HIP ARTHROPLASTY ANTERIOR APPROACH (Left) Patient reports pain as mild.   Patient seen in rounds with Dr. Wynelle Link. Patient is well, and has had no acute complaints or problems. States she is ready to go home. No issues overnight. Denies chest pain or SOB. Foley catheter removed this AM. We will continue therapy today.   Objective: Vital signs in last 24 hours: Temp:  [97.5 F (36.4 C)-98.8 F (37.1 C)] 98.3 F (36.8 C) (10/10 0557) Pulse Rate:  [64-88] 84 (10/10 0557) Resp:  [11-18] 17 (10/10 0557) BP: (96-157)/(54-84) 121/61 (10/10 0557) SpO2:  [98 %-100 %] 100 % (10/10 0557) Weight:  [46.3 kg] 46.3 kg (10/09 0852)  Intake/Output from previous day:  Intake/Output Summary (Last 24 hours) at 10/18/2017 0742 Last data filed at 10/18/2017 0731 Gross per 24 hour  Intake 4572.81 ml  Output 2505 ml  Net 2067.81 ml     Intake/Output this shift: Total I/O In: 111.3 [I.V.:111.3] Out: -   Labs: Recent Labs    10/18/17 0527  HGB 11.6*   Recent Labs    10/18/17 0527  WBC 10.2  RBC 3.72*  HCT 36.8  PLT 210   Recent Labs    10/18/17 0527  NA 140  K 3.8  CL 108  CO2 24  BUN 13  CREATININE 0.76  GLUCOSE 128*  CALCIUM 8.6*   Exam: General - Patient is Alert and Oriented Extremity - Neurologically intact Neurovascular intact Sensation intact distally Dorsiflexion/Plantar flexion intact Dressing - dressing C/D/I Motor Function - intact, moving foot and toes well on exam.   Past Medical History:  Diagnosis Date  . Allergy    hay fever  . Arthritis    osteoarthritis  . Cataract   . Difficult intubation    CHOLECYSTECTOMY 2003, NO SURGERY SINCE, "LITTLE DIFFICULT TO INTUBATE " PER PT  . Fibrocystic breast disease    resolved off caffeine & nicotine  . GERD (gastroesophageal reflux disease)    OCC  . Glaucoma    Dr Hillis Range  . Glaucoma    severe both eyes  . Heart murmur    2013 OCCASIONAL  PAC's  . Osteopenia   . Other and unspecified hyperlipidemia   . 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 Vitamin D deficiency HX OF    Assessment/Plan: 1 Day Post-Op Procedure(s) (LRB): LEFT TOTAL HIP ARTHROPLASTY ANTERIOR APPROACH (Left) Principal Problem:   OA (osteoarthritis) of hip  Estimated body mass index is 19.92 kg/m as calculated from the following:   Height as of this encounter: 5' (1.524 m).   Weight as of this encounter: 46.3 kg. Advance diet Up with therapy D/C IV fluids  DVT Prophylaxis - Xarelto Weight bearing as tolerated. D/C O2 and pulse ox and try on room air. Hemovac pulled without difficulty, will continue therapy.  Plan is to go Home after hospital stay. Plan for discharge after one session of therapy this AM if meeting goals with home exercise program. Follow-up in the office in 2 weeks.   Theresa Duty, PA-C Orthopedic Surgery 10/18/2017, 7:42 AM

## 2017-10-18 NOTE — Care Management Note (Signed)
Case Management Note  Patient Details  Name: [name] [name] MRN: [id number] Date of Birth: December 20, 1940  Subjective/Objective:    Bundled patient. Confirmed plan for HEP. Has RW and 3n1. [phone]                Action/Plan: Confirmed with office CM  Expected Discharge Date:  10/18/17               Expected Discharge Plan:  Home/Self Care  In-House Referral:  NA  Discharge planning Services  CM Consult  Post Acute Care Choice:  NA Choice offered to:  NA  DME Arranged:  N/A DME Agency:  NA  HH Arranged:  NA HH Agency:  NA  Status of Service:  Completed, signed off  If discussed at [place] of Stay Meetings, dates discussed:    Additional Comments:  Guadalupe Maple, RN 10/18/2017, 8:16 AM

## 2017-10-18 NOTE — Plan of Care (Signed)

## 2017-10-18 NOTE — Progress Notes (Signed)
Patient discharged to home w/ family. Given all belongings, instructions, prescriptions, equipment. All questions answered, verbalized understanding. Escorted to pov via w/c.

## 2017-10-18 NOTE — Progress Notes (Signed)
Physical Therapy Treatment Patient Details Name: [name] [name] MRN: [id number] DOB: Jan 01, 1941 Today's Date: 10/18/2017    History of Present Illness 77 YO female s/p L DA-THA on 10/17/17. PMH includes glaucoma, edema, OA, IBS, osteopenia, cataracts.     PT Comments    Pt is progressing well with mobility, she ambulated 150' with RW and completed stair training. She demonstrated good understanding of HEP. She is ready to DC home from PT standpoint.    Follow Up Recommendations  Follow surgeon's recommendation for DC plan and follow-up therapies;Supervision for mobility/OOB(HEP)     Equipment Recommendations  Rolling walker with 5" wheels    Recommendations for Other Services       Precautions / Restrictions Precautions Precautions: Fall Restrictions Weight Bearing Restrictions: No Other Position/Activity Restrictions: WBAT     Mobility  Bed Mobility               General bed mobility comments: up in recliner  Transfers Overall transfer level: Needs assistance Equipment used: Rolling walker (2 wheeled) Transfers: Sit to/from Stand Sit to Stand: Supervision         General transfer comment: VCs hand placement  Ambulation/Gait Ambulation/Gait assistance: Supervision Gait Distance (Feet): 150 Feet Assistive device: Rolling walker (2 wheeled) Gait Pattern/deviations: Step-to pattern;Decreased stride length;Trunk flexed;Antalgic;Decreased stance time - left;Decreased weight shift to left Gait velocity: decr    General Gait Details: steady with RW, no loss of balance, good sequencing   Stairs Stairs: Yes Stairs assistance: Min guard Stair Management: One rail Left;Forwards;With cane;Step to pattern Number of Stairs: 5 General stair comments: VCs sequencing   Wheelchair Mobility    Modified Rankin (Stroke Patients Only)       Balance Overall balance assessment: Mild deficits observed, not formally tested                                           Cognition Arousal/Alertness: Awake/alert Behavior During Therapy: WFL for tasks assessed/performed Overall Cognitive Status: Within Functional Limits for tasks assessed                                        Exercises Total Joint Exercises Ankle Circles/Pumps: AROM;Both;10 reps;Supine Quad Sets: AROM;Left;10 reps;Supine Short Arc Quad: AROM;Left;10 reps;Supine Heel Slides: AROM;AAROM;Left;10 reps;Supine Hip ABduction/ADduction: AROM;Left;10 reps;Supine(also 10x LLE standing) Long Arc Quad: AROM;Left;15 reps;Seated Knee Flexion: Standing;10 reps;Left Marching in Standing: AROM;Left;10 reps;Standing Standing Hip Extension: Left;10 reps;Standing    General Comments        Pertinent Vitals/Pain Pain Score: 5  Pain Location: L hip  Pain Descriptors / Indicators: Aching Pain Intervention(s): Limited activity within patient's tolerance;Monitored during session;Premedicated before session;Ice applied    Home Living                      Prior Function            PT Goals (current goals can now be found in the care plan section) Acute Rehab PT Goals Patient Stated Goal: walk without pain, travel PT Goal Formulation: With patient Time For Goal Achievement: 10/31/17 Potential to Achieve Goals: Good Progress towards PT goals: Goals met/education completed, patient discharged from PT    Frequency    7X/week      PT Plan Current plan remains appropriate  Co-evaluation              AM-PAC PT "6 Clicks" Daily Activity  Outcome Measure  Difficulty turning over in bed (including adjusting bedclothes, sheets and blankets)?: A Little Difficulty moving from lying on back to sitting on the side of the bed? : A Little Difficulty sitting down on and standing up from a chair with arms (e.g., wheelchair, bedside commode, etc,.)?: A Little Help needed moving to and from a bed to chair (including a wheelchair)?: A Little Help  needed walking in hospital room?: A Little Help needed climbing 3-5 steps with a railing? : A Little 6 Click Score: 18    End of Session Equipment Utilized During Treatment: Gait belt Activity Tolerance: Patient tolerated treatment well Patient left: in chair;with chair alarm set;with call bell/phone within reach Nurse Communication: Mobility status PT Visit Diagnosis: Other abnormalities of gait and mobility (R26.89);Difficulty in walking, not elsewhere classified (R26.2)     Time: 1601-0932 PT Time Calculation (min) (ACUTE ONLY): 33 min  Charges:  $Gait Training: 8-22 mins $Therapeutic Exercise: 8-22 mins                    Blondell Reveal Kistler PT 10/18/2017  Acute Rehabilitation Services Pager [phone] Office [phone]

## 2017-10-22 NOTE — Discharge Summary (Signed)
Physician Discharge Summary   Patient ID: [name] [name] MRN: [id number] DOB/AGE: [birth year] 77 y.o.  Admit date: 10/17/2017 Discharge date: 10/18/2017  Primary Diagnosis: Osteoarthritis, left hip   Admission Diagnoses:  Past Medical History:  Diagnosis Date  . Allergy    hay fever  . Arthritis    osteoarthritis  . Cataract   . Difficult intubation    CHOLECYSTECTOMY 2003, NO SURGERY SINCE, "LITTLE DIFFICULT TO INTUBATE " PER PT  . Fibrocystic breast disease    resolved off caffeine & nicotine  . GERD (gastroesophageal reflux disease)    OCC  . Glaucoma    Dr Hillis Range  . Glaucoma    severe both eyes  . Heart murmur    2013 OCCASIONAL PAC's  . Osteopenia   . Other and unspecified hyperlipidemia   . 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 Vitamin D deficiency HX OF   Discharge Diagnoses:   Principal Problem:   OA (osteoarthritis) of hip  Estimated body mass index is 19.92 kg/m as calculated from the following:   Height as of this encounter: 5' (1.524 m).   Weight as of this encounter: 46.3 kg.  Procedure:  Procedure(s) (LRB): LEFT TOTAL HIP ARTHROPLASTY ANTERIOR APPROACH (Left)   Consults: None  HPI: [name] [name] is a 77 y.o. female who has advanced end-stage arthritis of their Left hip with progressively worsening pain and dysfunction.The patient has failed nonoperative management and presents for total hip arthroplasty.   Laboratory Data: Admission on 10/17/2017, Discharged on 10/18/2017  Component Date Value Ref Range Status  . ABO/RH(D) 10/17/2017 O POS   Final  . Antibody Screen 10/17/2017 NEG   Final  . Sample Expiration 10/17/2017    Final                   Value:10/20/2017 Performed at [facility], [place] [address], [place], [place] [address]   . ABO/RH(D) 10/17/2017    Final                   Value:O POS Performed at [facility], [place] [address] [place], [place] [address]   . WBC 10/18/2017  10.2  4.0 - 10.5 K/uL Final  . RBC 10/18/2017 3.72* 3.87 - 5.11 MIL/uL Final  . Hemoglobin 10/18/2017 11.6* 12.0 - 15.0 g/dL Final  . HCT 10/18/2017 36.8  36.0 - 46.0 % Final  . MCV 10/18/2017 98.9  80.0 - 100.0 fL Final  . MCH 10/18/2017 31.2  26.0 - 34.0 pg Final  . MCHC 10/18/2017 31.5  30.0 - 36.0 g/dL Final  . RDW 10/18/2017 13.0  11.5 - 15.5 % Final  . Platelets 10/18/2017 210  150 - 400 K/uL Final  . nRBC 10/18/2017 0.0  0.0 - 0.2 % Final   Performed at [facility], [place] [address] [place], [place] [address]  . Sodium 10/18/2017 140  135 - 145 mmol/L Final  . Potassium 10/18/2017 3.8  3.5 - 5.1 mmol/L Final  . Chloride 10/18/2017 108  98 - 111 mmol/L Final  . CO2 10/18/2017 24  22 - 32 mmol/L Final  . Glucose, Bld 10/18/2017 128* 70 - 99 mg/dL Final  . BUN 10/18/2017 13  8 - 23 mg/dL Final  . Creatinine, Ser 10/18/2017 0.76  0.44 - 1.00 mg/dL Final  . Calcium 10/18/2017 8.6* 8.9 - 10.3 mg/dL Final  . GFR calc non Af Amer 10/18/2017 >60  >60 mL/min  Final  . GFR calc Af Amer 10/18/2017 >60  >60 mL/min Final   Comment: (NOTE) The eGFR has been calculated using the CKD EPI equation. This calculation has not been validated in all clinical situations. eGFR's persistently <60 mL/min signify possible Chronic Kidney Disease.   Georgiann Hahn gap 10/18/2017 8  5 - 15 Final   Performed at [facility], [place] [address] [place], [place] [address]  Hospital Outpatient Visit on 10/04/2017  Component Date Value Ref Range Status  . MRSA, PCR 10/04/2017 NEGATIVE  NEGATIVE Final  . Staphylococcus aureus 10/04/2017 NEGATIVE  NEGATIVE Final   Comment: (NOTE) The Xpert SA Assay (FDA approved for NASAL specimens in patients 20 years of age and older), is one component of a comprehensive surveillance program. It is not intended to diagnose infection nor to guide or monitor treatment. Performed at [facility], [place] [address], [place],  [place] [address]   . aPTT 10/04/2017 31  24 - 36 seconds Final   Performed at [facility], [place] [address] [place], [place] [address]  . WBC 10/04/2017 7.2  4.0 - 10.5 K/uL Final  . RBC 10/04/2017 4.62  3.87 - 5.11 MIL/uL Final  . Hemoglobin 10/04/2017 14.6  12.0 - 15.0 g/dL Final  . HCT 10/04/2017 45.1  36.0 - 46.0 % Final  . MCV 10/04/2017 97.6  78.0 - 100.0 fL Final  . MCH 10/04/2017 31.6  26.0 - 34.0 pg Final  . MCHC 10/04/2017 32.4  30.0 - 36.0 g/dL Final  . RDW 10/04/2017 13.0  11.5 - 15.5 % Final  . Platelets 10/04/2017 237  150 - 400 K/uL Final   Performed at [facility], [place] [address] [place], [place] [address]  . Sodium 10/04/2017 144  135 - 145 mmol/L Final  . Potassium 10/04/2017 4.5  3.5 - 5.1 mmol/L Final  . Chloride 10/04/2017 110  98 - 111 mmol/L Final  . CO2 10/04/2017 24  22 - 32 mmol/L Final  . Glucose, Bld 10/04/2017 117* 70 - 99 mg/dL Final  . BUN 10/04/2017 24* 8 - 23 mg/dL Final  . Creatinine, Ser 10/04/2017 0.85  0.44 - 1.00 mg/dL Final  . Calcium 10/04/2017 9.9  8.9 - 10.3 mg/dL Final  . Total Protein 10/04/2017 7.3  6.5 - 8.1 g/dL Final  . Albumin 10/04/2017 4.1  3.5 - 5.0 g/dL Final  . AST 10/04/2017 27  15 - 41 U/L Final  . ALT 10/04/2017 21  0 - 44 U/L Final  . Alkaline Phosphatase 10/04/2017 59  38 - 126 U/L Final  . Total Bilirubin 10/04/2017 1.2  0.3 - 1.2 mg/dL Final  . GFR calc non Af Amer 10/04/2017 >60  >60 mL/min Final  . GFR calc Af Amer 10/04/2017 >60  >60 mL/min Final   Comment: (NOTE) The eGFR has been calculated using the CKD EPI equation. This calculation has not been validated in all clinical situations. eGFR's persistently <60 mL/min signify possible Chronic Kidney Disease.   Georgiann Hahn gap 10/04/2017 10  5 - 15 Final   Performed at [facility], [place] [address], [place], [place] [address]  . Prothrombin Time 10/04/2017 12.7  11.4 - 15.2 seconds Final  . INR 10/04/2017 0.97   Final    Performed at [facility], [place] [address], [place], [place] [address]     X-Rays:Dg Pelvis Portable  Result Date: 10/17/2017 CLINICAL DATA:  Status post left hip replacement EXAM: PORTABLE  PELVIS 1-2 VIEWS COMPARISON:  None. FINDINGS: Pelvic ring is intact. Left hip replacement is now seen. No acute fracture or dislocation is noted. No soft tissue changes are seen. Surgical drain is noted in place. IMPRESSION: Status post left hip replacement. Electronically Signed   By: Mark  Lukens M.D.   On: 10/17/2017 14:58   Dg C-arm 1-60 Min-no Report  Result Date: 10/17/2017 Fluoroscopy was utilized by the requesting physician.  No radiographic interpretation.   Dg Hip Operative Unilat W Or W/o Pelvis Left  Result Date: 10/17/2017 CLINICAL DATA:  Status post left hip joint prosthesis placement. EXAM: OPERATIVE left HIP (WITH PELVIS IF PERFORMED) 2 VIEWS TECHNIQUE: Fluoroscopic spot image(s) were submitted for interpretation post-operatively. COMPARISON:  None. FINDINGS: Reported fluoro time is 12 seconds with dose of 0.496 mGy.The patient has undergone left hip joint prosthesis placement. Radiographic positioning of the prosthetic components is good. The interface with the native bone appears normal. IMPRESSION: No immediate postprocedure complication following left hip joint prosthesis placement. Electronically Signed   By: David  Jordan M.D.   On: 10/17/2017 13:01    EKG: Orders placed or performed during the hospital encounter of 10/04/17  . EKG 12 lead  . EKG 12 lead     Hospital Course: [name] [name] is a 76 y.o. who was admitted to [facility]. They were brought to the operating room on 10/17/2017 and underwent Procedure(s): LEFT TOTAL HIP ARTHROPLASTY ANTERIOR APPROACH.  Patient tolerated the procedure well and was later transferred to the recovery room and then to the orthopaedic floor for postoperative care. They were given PO and IV analgesics for pain control  following their surgery. They were given 24 hours of postoperative antibiotics of  Anti-infectives (From admission, onward)   Start     Dose/Rate Route Frequency Ordered Stop   10/17/17 1700  ceFAZolin (ANCEF) IVPB 1 g/50 mL premix     1 g 100 mL/hr over 30 Minutes Intravenous Every 6 hours 10/17/17 1512 10/17/17 2303   10/17/17 0845  ceFAZolin (ANCEF) IVPB 2g/100 mL premix     2 g 200 mL/hr over 30 Minutes Intravenous On call to O.R. 10/17/17 0844 10/17/17 1107     and started on DVT prophylaxis in the form of Xarelto.   PT and OT were ordered for total joint protocol. Discharge planning consulted to help with postop disposition and equipment needs.  Patient had a good night on the evening of surgery. They started to get up OOB with therapy on POD #0. Pt was seen during rounds and was ready to go home pending progress with therapy. Hemovac drain was pulled without difficulty. She worked with therapy on POD #1 and was meeting her goals. Pt was discharged to home later that day in stable condition.  Diet: Regular diet Activity: WBAT Follow-up: in 2 weeks with Dr. Aluisio Disposition: Home with home exercise program Discharged Condition: stable   Discharge Instructions    Call MD / Call 911   Complete by:  As directed    If you experience chest pain or shortness of breath, CALL 911 and be transported to the hospital emergency room.  If you develope a fever above 101 F, pus (white drainage) or increased drainage or redness at the wound, or calf pain, call your surgeon's office.   Change dressing   Complete by:  As directed    You may change your dressing on Friday (10/11), then change the dressing daily with sterile 4 x 4 inch gauze dressing   and paper tape.   Constipation Prevention   Complete by:  As directed    Drink plenty of fluids.  Prune juice may be helpful.  You may use a stool softener, such as Colace (over the counter) 100 mg twice a day.  Use MiraLax (over the counter) for  constipation as needed.   Diet - low sodium heart healthy   Complete by:  As directed    Discharge instructions   Complete by:  As directed    Dr. Frank Aluisio Total Joint Specialist [facility] [address] [place], [place] [address] [phone]  ANTERIOR APPROACH TOTAL HIP REPLACEMENT POSTOPERATIVE DIRECTIONS   Hip Rehabilitation, Guidelines Following Surgery  The results of a hip operation are greatly improved after range of motion and muscle strengthening exercises. Follow all safety measures which are given to protect your hip. If any of these exercises cause increased pain or swelling in your joint, decrease the amount until you are comfortable again. Then slowly increase the exercises. Call your caregiver if you have problems or questions.   HOME CARE INSTRUCTIONS  Remove items at home which could result in a fall. This includes throw rugs or furniture in walking pathways.  ICE to the affected hip every three hours for 30 minutes at a time and then as needed for pain and swelling.  Continue to use ice on the hip for pain and swelling from surgery. You may notice swelling that will progress down to the foot and ankle.  This is normal after surgery.  Elevate the leg when you are not up walking on it.   Continue to use the breathing machine which will help keep your temperature down.  It is common for your temperature to cycle up and down following surgery, especially at night when you are not up moving around and exerting yourself.  The breathing machine keeps your lungs expanded and your temperature down.  DIET You may resume your previous home diet once your are discharged from the hospital.  DRESSING / WOUND CARE / SHOWERING You may shower 3 days after surgery, but keep the wounds dry during showering.  You may use an occlusive plastic wrap (Press'n Seal for example), NO SOAKING/SUBMERGING IN THE BATHTUB.  If the bandage gets wet, change with a clean dry gauze.  If  the incision gets wet, pat the wound dry with a clean towel. You may start showering once you are discharged home but do not submerge the incision under water. Just pat the incision dry and apply a dry gauze dressing on daily. Change the surgical dressing daily and reapply a dry dressing each time.  ACTIVITY Walk with your walker as instructed. Use walker as long as suggested by your caregivers. Avoid periods of inactivity such as sitting longer than an hour when not asleep. This helps prevent blood clots.  You may resume a sexual relationship in one month or when given the OK by your doctor.  You may return to work once you are cleared by your doctor.  Do not drive a car for 6 weeks or until released by you surgeon.  Do not drive while taking narcotics.  WEIGHT BEARING Weight bearing as tolerated with assist device (walker, cane, etc) as directed, use it as long as suggested by your surgeon or therapist, typically at least 4-6 weeks.  POSTOPERATIVE CONSTIPATION PROTOCOL Constipation - defined medically as fewer than three stools per week and severe constipation as less than one stool per week.  One   of the most common issues patients have following surgery is constipation.  Even if you have a regular bowel pattern at home, your normal regimen is likely to be disrupted due to multiple reasons following surgery.  Combination of anesthesia, postoperative narcotics, change in appetite and fluid intake all can affect your bowels.  In order to avoid complications following surgery, here are some recommendations in order to help you during your recovery period.  Colace (docusate) - Pick up an over-the-counter form of Colace or another stool softener and take twice a day as long as you are requiring postoperative pain medications.  Take with a full glass of water daily.  If you experience loose stools or diarrhea, hold the colace until you stool forms back up.  If your symptoms do not get better within  1 week or if they get worse, check with your doctor.  Dulcolax (bisacodyl) - Pick up over-the-counter and take as directed by the product packaging as needed to assist with the movement of your bowels.  Take with a full glass of water.  Use this product as needed if not relieved by Colace only.   MiraLax (polyethylene glycol) - Pick up over-the-counter to have on hand.  MiraLax is a solution that will increase the amount of water in your bowels to assist with bowel movements.  Take as directed and can mix with a glass of water, juice, soda, coffee, or tea.  Take if you go more than two days without a movement. Do not use MiraLax more than once per day. Call your doctor if you are still constipated or irregular after using this medication for 7 days in a row.  If you continue to have problems with postoperative constipation, please contact the office for further assistance and recommendations.  If you experience "the worst abdominal pain ever" or develop nausea or vomiting, please contact the office immediatly for further recommendations for treatment.  ITCHING  If you experience itching with your medications, try taking only a single pain pill, or even half a pain pill at a time.  You can also use Benadryl over the counter for itching or also to help with sleep.   TED HOSE STOCKINGS Wear the elastic stockings on both legs for three weeks following surgery during the day but you may remove then at night for sleeping.  MEDICATIONS See your medication summary on the "After Visit Summary" that the nursing staff will review with you prior to discharge.  You may have some home medications which will be placed on hold until you complete the course of blood thinner medication.  It is important for you to complete the blood thinner medication as prescribed by your surgeon.  Continue your approved medications as instructed at time of discharge.  PRECAUTIONS If you experience chest pain or shortness of  breath - call 911 immediately for transfer to the hospital emergency department.  If you develop a fever greater that 101 F, purulent drainage from wound, increased redness or drainage from wound, foul odor from the wound/dressing, or calf pain - CONTACT YOUR SURGEON.                                                   FOLLOW-UP APPOINTMENTS Make sure you keep all of your appointments after your operation with your surgeon and caregivers. You should call the   office at the above phone number and make an appointment for approximately two weeks after the date of your surgery or on the date instructed by your surgeon outlined in the "After Visit Summary".  RANGE OF MOTION AND STRENGTHENING EXERCISES  These exercises are designed to help you keep full movement of your hip joint. Follow your caregiver's or physical therapist's instructions. Perform all exercises about fifteen times, three times per day or as directed. Exercise both hips, even if you have had only one joint replacement. These exercises can be done on a training (exercise) mat, on the floor, on a table or on a bed. Use whatever works the best and is most comfortable for you. Use music or television while you are exercising so that the exercises are a pleasant break in your day. This will make your life better with the exercises acting as a break in routine you can look forward to.  Lying on your back, slowly slide your foot toward your buttocks, raising your knee up off the floor. Then slowly slide your foot back down until your leg is straight again.  Lying on your back spread your legs as far apart as you can without causing discomfort.  Lying on your side, raise your upper leg and foot straight up from the floor as far as is comfortable. Slowly lower the leg and repeat.  Lying on your back, tighten up the muscle in the front of your thigh (quadriceps muscles). You can do this by keeping your leg straight and trying to raise your heel off the  floor. This helps strengthen the largest muscle supporting your knee.  Lying on your back, tighten up the muscles of your buttocks both with the legs straight and with the knee bent at a comfortable angle while keeping your heel on the floor.   IF YOU ARE TRANSFERRED TO A SKILLED REHAB FACILITY If the patient is transferred to a skilled rehab facility following release from the hospital, a list of the current medications will be sent to the facility for the patient to continue.  When discharged from the skilled rehab facility, please have the facility set up the patient's Home Health Physical Therapy prior to being released. Also, the skilled facility will be responsible for providing the patient with their medications at time of release from the facility to include their pain medication, the muscle relaxants, and their blood thinner medication. If the patient is still at the rehab facility at time of the two week follow up appointment, the skilled rehab facility will also need to assist the patient in arranging follow up appointment in our office and any transportation needs.  MAKE SURE YOU:  Understand these instructions.  Get help right away if you are not doing well or get worse.    Pick up stool softner and laxative for home use following surgery while on pain medications. Do not submerge incision under water. Please use good hand washing techniques while changing dressing each day. May shower starting three days after surgery. Please use a clean towel to pat the incision dry following showers. Continue to use ice for pain and swelling after surgery. Do not use any lotions or creams on the incision until instructed by your surgeon.   Do not sit on low chairs, stoools or toilet seats, as it may be difficult to get up from low surfaces   Complete by:  As directed    Driving restrictions   Complete by:  As directed      No driving for two weeks   TED hose   Complete by:  As directed    Use  stockings (TED hose) for three weeks on both leg(s).  You may remove them at night for sleeping.   Weight bearing as tolerated   Complete by:  As directed      Allergies as of 10/18/2017      Reactions   Bee Venom Anaphylaxis   Highly allergic to Bee Stings    Aspirin Other (See Comments)   STOMACH UPSET   Latex Dermatitis   Powder in latex   Other    NITROUS OXIDE =NAUSEA      Medication List    STOP taking these medications   CALCIUM + D PO   GLUCOSAMINE CHONDR COMPLEX PO   OMEGA 3 PO   OVER THE COUNTER MEDICATION   OVER THE COUNTER MEDICATION   TURMERIC PO   Ubiquinol 100 MG Caps   UNABLE TO FIND     TAKE these medications   dorzolamide 2 % ophthalmic solution Commonly known as:  TRUSOPT Place 1 drop into both eyes 2 (two) times daily.   EPINEPHrine 0.3 mg/0.3 mL Soaj injection Commonly known as:  EPI-PEN Inject 0.3 mLs (0.3 mg total) into the muscle once.   HYDROcodone-acetaminophen 5-325 MG tablet Commonly known as:  NORCO/VICODIN Take 1-2 tablets by mouth every 6 (six) hours as needed for severe pain (pain score 4-6).   methocarbamol 500 MG tablet Commonly known as:  ROBAXIN Take 1 tablet (500 mg total) by mouth every 6 (six) hours as needed for muscle spasms.   rivaroxaban 10 MG Tabs tablet Commonly known as:  XARELTO Take 1 tablet (10 mg total) by mouth daily with breakfast for 19 days.   simvastatin 20 MG tablet Commonly known as:  ZOCOR Take 1 tablet by mouth at  bedtime   spironolactone 25 MG tablet Commonly known as:  ALDACTONE Take 1 tablet by mouth  daily What changed:  when to take this   traMADol 50 MG tablet Commonly known as:  ULTRAM Take 1-2 tablets (50-100 mg total) by mouth every 6 (six) hours as needed for moderate pain.   travoprost (benzalkonium) 0.004 % ophthalmic solution Commonly known as:  TRAVATAN Place 1 drop into both eyes at bedtime.            Discharge Care Instructions  (From admission, onward)          Start     Ordered   10/18/17 0000  Weight bearing as tolerated     10/18/17 0745   10/18/17 0000  Change dressing    Comments:  You may change your dressing on Friday (10/11), then change the dressing daily with sterile 4 x 4 inch gauze dressing and paper tape.   10/18/17 0745         Follow-up Information    [name], MD. Schedule an appointment as soon as possible for a visit on 11/01/2017.   Specialty:  Orthopedic Surgery Contact information: [address] [place] [address] [place] [address] [phone]           Signed: Theresa Duty, PA-C Orthopedic Surgery 10/22/2017, 12:43 PM

## 2017-11-13 ENCOUNTER — Ambulatory Visit
Admission: RE | Admit: 2017-11-13 | Discharge: 2017-11-13 | Disposition: A | Discharge status: Home / Self Care | Payer: Medicare Other | Source: Ambulatory Visit | Attending: Internal Medicine | Admitting: Internal Medicine

## 2017-11-13 DIAGNOSIS — Z1231 Encounter for screening mammogram for malignant neoplasm of breast: Secondary | ICD-10-CM

## 2017-11-20 DIAGNOSIS — Z471 Aftercare following joint replacement surgery: Secondary | ICD-10-CM | POA: Diagnosis not present

## 2017-11-20 DIAGNOSIS — Z96642 Presence of left artificial hip joint: Secondary | ICD-10-CM | POA: Diagnosis not present

## 2017-11-20 DIAGNOSIS — M1612 Unilateral primary osteoarthritis, left hip: Secondary | ICD-10-CM | POA: Diagnosis not present

## 2017-12-03 DIAGNOSIS — H6123 Impacted cerumen, bilateral: Secondary | ICD-10-CM | POA: Diagnosis not present

## 2017-12-03 DIAGNOSIS — H903 Sensorineural hearing loss, bilateral: Secondary | ICD-10-CM | POA: Diagnosis not present

## 2017-12-06 ENCOUNTER — Encounter (HOSPITAL_COMMUNITY): Payer: Self-pay

## 2017-12-06 ENCOUNTER — Emergency Department (HOSPITAL_COMMUNITY): Payer: Medicare Other

## 2017-12-06 ENCOUNTER — Inpatient Hospital Stay (HOSPITAL_COMMUNITY)
Admission: EM | Admit: 2017-12-06 | Discharge: 2017-12-10 | DRG: 481 | Disposition: A | Discharge status: Home Health | Payer: Medicare Other | Attending: Internal Medicine | Admitting: Internal Medicine

## 2017-12-06 ENCOUNTER — Other Ambulatory Visit: Payer: Self-pay

## 2017-12-06 DIAGNOSIS — T84011A Broken internal left hip prosthesis, initial encounter: Secondary | ICD-10-CM | POA: Diagnosis not present

## 2017-12-06 DIAGNOSIS — M169 Osteoarthritis of hip, unspecified: Secondary | ICD-10-CM | POA: Diagnosis present

## 2017-12-06 DIAGNOSIS — Z800 Family history of malignant neoplasm of digestive organs: Secondary | ICD-10-CM

## 2017-12-06 DIAGNOSIS — D539 Nutritional anemia, unspecified: Secondary | ICD-10-CM | POA: Diagnosis not present

## 2017-12-06 DIAGNOSIS — R6889 Other general symptoms and signs: Secondary | ICD-10-CM | POA: Diagnosis present

## 2017-12-06 DIAGNOSIS — R011 Cardiac murmur, unspecified: Secondary | ICD-10-CM | POA: Diagnosis present

## 2017-12-06 DIAGNOSIS — W010XXA Fall on same level from slipping, tripping and stumbling without subsequent striking against object, initial encounter: Secondary | ICD-10-CM | POA: Diagnosis present

## 2017-12-06 DIAGNOSIS — Z8261 Family history of arthritis: Secondary | ICD-10-CM

## 2017-12-06 DIAGNOSIS — Z8249 Family history of ischemic heart disease and other diseases of the circulatory system: Secondary | ICD-10-CM

## 2017-12-06 DIAGNOSIS — K219 Gastro-esophageal reflux disease without esophagitis: Secondary | ICD-10-CM | POA: Diagnosis present

## 2017-12-06 DIAGNOSIS — M9702XA Periprosthetic fracture around internal prosthetic left hip joint, initial encounter: Secondary | ICD-10-CM | POA: Diagnosis present

## 2017-12-06 DIAGNOSIS — Z83511 Family history of glaucoma: Secondary | ICD-10-CM

## 2017-12-06 DIAGNOSIS — S7292XA Unspecified fracture of left femur, initial encounter for closed fracture: Secondary | ICD-10-CM | POA: Diagnosis not present

## 2017-12-06 DIAGNOSIS — M1612 Unilateral primary osteoarthritis, left hip: Secondary | ICD-10-CM | POA: Diagnosis not present

## 2017-12-06 DIAGNOSIS — K589 Irritable bowel syndrome without diarrhea: Secondary | ICD-10-CM | POA: Diagnosis present

## 2017-12-06 DIAGNOSIS — Z91030 Bee allergy status: Secondary | ICD-10-CM

## 2017-12-06 DIAGNOSIS — S72092A Other fracture of head and neck of left femur, initial encounter for closed fracture: Principal | ICD-10-CM | POA: Diagnosis present

## 2017-12-06 DIAGNOSIS — Z7901 Long term (current) use of anticoagulants: Secondary | ICD-10-CM

## 2017-12-06 DIAGNOSIS — M810 Age-related osteoporosis without current pathological fracture: Secondary | ICD-10-CM | POA: Diagnosis present

## 2017-12-06 DIAGNOSIS — Z91040 Latex allergy status: Secondary | ICD-10-CM | POA: Diagnosis not present

## 2017-12-06 DIAGNOSIS — E860 Dehydration: Secondary | ICD-10-CM | POA: Diagnosis present

## 2017-12-06 DIAGNOSIS — H409 Unspecified glaucoma: Secondary | ICD-10-CM | POA: Diagnosis not present

## 2017-12-06 DIAGNOSIS — E559 Vitamin D deficiency, unspecified: Secondary | ICD-10-CM | POA: Diagnosis present

## 2017-12-06 DIAGNOSIS — Z79899 Other long term (current) drug therapy: Secondary | ICD-10-CM

## 2017-12-06 DIAGNOSIS — Z96649 Presence of unspecified artificial hip joint: Secondary | ICD-10-CM

## 2017-12-06 DIAGNOSIS — S72142A Displaced intertrochanteric fracture of left femur, initial encounter for closed fracture: Secondary | ICD-10-CM | POA: Diagnosis not present

## 2017-12-06 DIAGNOSIS — Z681 Body mass index (BMI) 19 or less, adult: Secondary | ICD-10-CM | POA: Diagnosis not present

## 2017-12-06 DIAGNOSIS — Z87891 Personal history of nicotine dependence: Secondary | ICD-10-CM

## 2017-12-06 DIAGNOSIS — D62 Acute posthemorrhagic anemia: Secondary | ICD-10-CM | POA: Diagnosis not present

## 2017-12-06 DIAGNOSIS — Z9049 Acquired absence of other specified parts of digestive tract: Secondary | ICD-10-CM

## 2017-12-06 DIAGNOSIS — R030 Elevated blood-pressure reading, without diagnosis of hypertension: Secondary | ICD-10-CM | POA: Diagnosis present

## 2017-12-06 DIAGNOSIS — M79605 Pain in left leg: Secondary | ICD-10-CM | POA: Diagnosis not present

## 2017-12-06 DIAGNOSIS — R279 Unspecified lack of coordination: Secondary | ICD-10-CM | POA: Diagnosis not present

## 2017-12-06 DIAGNOSIS — Z886 Allergy status to analgesic agent status: Secondary | ICD-10-CM

## 2017-12-06 DIAGNOSIS — Z888 Allergy status to other drugs, medicaments and biological substances status: Secondary | ICD-10-CM | POA: Diagnosis not present

## 2017-12-06 DIAGNOSIS — Z8349 Family history of other endocrine, nutritional and metabolic diseases: Secondary | ICD-10-CM

## 2017-12-06 DIAGNOSIS — Z79891 Long term (current) use of opiate analgesic: Secondary | ICD-10-CM

## 2017-12-06 DIAGNOSIS — Y92000 Kitchen of unspecified non-institutional (private) residence as  the place of occurrence of the external cause: Secondary | ICD-10-CM

## 2017-12-06 DIAGNOSIS — R000 Tachycardia, unspecified: Secondary | ICD-10-CM | POA: Diagnosis not present

## 2017-12-06 DIAGNOSIS — Z833 Family history of diabetes mellitus: Secondary | ICD-10-CM

## 2017-12-06 DIAGNOSIS — Z471 Aftercare following joint replacement surgery: Secondary | ICD-10-CM | POA: Diagnosis not present

## 2017-12-06 DIAGNOSIS — E785 Hyperlipidemia, unspecified: Secondary | ICD-10-CM | POA: Diagnosis present

## 2017-12-06 DIAGNOSIS — Z8781 Personal history of (healed) traumatic fracture: Secondary | ICD-10-CM | POA: Diagnosis not present

## 2017-12-06 DIAGNOSIS — S72402A Unspecified fracture of lower end of left femur, initial encounter for closed fracture: Secondary | ICD-10-CM | POA: Diagnosis not present

## 2017-12-06 DIAGNOSIS — Z8042 Family history of malignant neoplasm of prostate: Secondary | ICD-10-CM

## 2017-12-06 DIAGNOSIS — Z820 Family history of epilepsy and other diseases of the nervous system: Secondary | ICD-10-CM

## 2017-12-06 DIAGNOSIS — Z743 Need for continuous supervision: Secondary | ICD-10-CM | POA: Diagnosis not present

## 2017-12-06 DIAGNOSIS — Z96642 Presence of left artificial hip joint: Secondary | ICD-10-CM | POA: Diagnosis not present

## 2017-12-06 LAB — CBC WITH DIFFERENTIAL/PLATELET
Abs Immature Granulocytes: 0.08 10*3/uL — ABNORMAL HIGH (ref 0.00–0.07)
BASOS ABS: 0 10*3/uL (ref 0.0–0.1)
Basophils Relative: 0 %
EOS ABS: 0 10*3/uL (ref 0.0–0.5)
Eosinophils Relative: 0 %
HEMATOCRIT: 43 % (ref 36.0–46.0)
Hemoglobin: 13.4 g/dL (ref 12.0–15.0)
IMMATURE GRANULOCYTES: 1 %
LYMPHS ABS: 0.6 10*3/uL — AB (ref 0.7–4.0)
Lymphocytes Relative: 7 %
MCH: 30.3 pg (ref 26.0–34.0)
MCHC: 31.2 g/dL (ref 30.0–36.0)
MCV: 97.3 fL (ref 80.0–100.0)
Monocytes Absolute: 0.6 10*3/uL (ref 0.1–1.0)
Monocytes Relative: 7 %
NEUTROS ABS: 7.7 10*3/uL (ref 1.7–7.7)
NEUTROS PCT: 85 %
NRBC: 0 % (ref 0.0–0.2)
PLATELETS: 249 10*3/uL (ref 150–400)
RBC: 4.42 MIL/uL (ref 3.87–5.11)
RDW: 13.1 % (ref 11.5–15.5)
WBC: 9 10*3/uL (ref 4.0–10.5)

## 2017-12-06 LAB — BASIC METABOLIC PANEL
ANION GAP: 7 (ref 5–15)
BUN: 17 mg/dL (ref 8–23)
CALCIUM: 9.4 mg/dL (ref 8.9–10.3)
CO2: 22 mmol/L (ref 22–32)
Chloride: 112 mmol/L — ABNORMAL HIGH (ref 98–111)
Creatinine, Ser: 0.71 mg/dL (ref 0.44–1.00)
GFR calc non Af Amer: >60 mL/min (ref >60)
Glucose, Bld: 123 mg/dL — ABNORMAL HIGH (ref 70–99)
POTASSIUM: 4.5 mmol/L (ref 3.5–5.1)
Sodium: 141 mmol/L (ref 135–145)

## 2017-12-06 LAB — TYPE AND SCREEN
ABO/RH(D): O POS
Antibody Screen: NEGATIVE

## 2017-12-06 LAB — CALCIUM: Calcium: 9.5 mg/dL (ref 8.9–10.3)

## 2017-12-06 LAB — ALBUMIN: Albumin: 4.5 g/dL (ref 3.5–5.0)

## 2017-12-06 MED ORDER — ONDANSETRON HCL 4 MG/2ML IJ SOLN
4.0000 mg | Freq: Once | INTRAMUSCULAR | Status: AC
  Administered 2017-12-06: 4 mg via INTRAVENOUS
  Filled 2017-12-06: qty 2

## 2017-12-06 MED ORDER — METHOCARBAMOL 1000 MG/10ML IJ SOLN
500.0000 mg | Freq: Four times a day (QID) | INTRAVENOUS | Status: DC | PRN
  Filled 2017-12-06: qty 5

## 2017-12-06 MED ORDER — POLYETHYL GLYCOL-PROPYL GLYCOL 0.4-0.3 % OP GEL
Freq: Every day | OPHTHALMIC | Status: DC | PRN
  Filled 2017-12-06: qty 10

## 2017-12-06 MED ORDER — LIP MEDEX EX OINT
TOPICAL_OINTMENT | CUTANEOUS | Status: AC
  Administered 2017-12-06: 13:00:00
  Filled 2017-12-06: qty 7

## 2017-12-06 MED ORDER — TRAVOPROST (BAK FREE) 0.004 % OP SOLN
1.0000 [drp] | Freq: Every day | OPHTHALMIC | Status: DC
  Filled 2017-12-06: qty 2.5

## 2017-12-06 MED ORDER — HYDRALAZINE HCL 20 MG/ML IJ SOLN: 5.0000 mg | Freq: Four times a day (QID) | INTRAMUSCULAR | Status: DC | PRN

## 2017-12-06 MED ORDER — FENTANYL CITRATE (PF) 100 MCG/2ML IJ SOLN
50.0000 ug | Freq: Once | INTRAMUSCULAR | Status: AC
  Administered 2017-12-06: 50 ug via INTRAVENOUS
  Filled 2017-12-06: qty 2

## 2017-12-06 MED ORDER — ENOXAPARIN SODIUM 40 MG/0.4ML ~~LOC~~ SOLN: 40.0000 mg | SUBCUTANEOUS | Status: DC

## 2017-12-06 MED ORDER — ONDANSETRON HCL 4 MG PO TABS: 4.0000 mg | ORAL_TABLET | Freq: Three times a day (TID) | ORAL | Status: DC | PRN

## 2017-12-06 MED ORDER — HYDROCODONE-ACETAMINOPHEN 5-325 MG PO TABS: 1.0000 | ORAL_TABLET | Freq: Four times a day (QID) | ORAL | Status: DC | PRN

## 2017-12-06 MED ORDER — ONDANSETRON HCL 4 MG/2ML IJ SOLN
4.0000 mg | Freq: Four times a day (QID) | INTRAMUSCULAR | Status: DC | PRN
  Administered 2017-12-06: 4 mg via INTRAVENOUS
  Filled 2017-12-06: qty 2

## 2017-12-06 MED ORDER — DORZOLAMIDE HCL 2 % OP SOLN
1.0000 [drp] | Freq: Two times a day (BID) | OPHTHALMIC | Status: DC
  Administered 2017-12-06 – 2017-12-10 (×6): 1 [drp] via OPHTHALMIC
  Filled 2017-12-06: qty 10

## 2017-12-06 MED ORDER — MORPHINE SULFATE (PF) 2 MG/ML IV SOLN
0.5000 mg | INTRAVENOUS | Status: DC | PRN
  Administered 2017-12-06: 0.5 mg via INTRAVENOUS
  Filled 2017-12-06: qty 1

## 2017-12-06 MED ORDER — SODIUM CHLORIDE 0.9 % IV SOLN
INTRAVENOUS | Status: AC
  Administered 2017-12-06: 13:00:00 via INTRAVENOUS

## 2017-12-06 MED ORDER — SENNOSIDES-DOCUSATE SODIUM 8.6-50 MG PO TABS: 1.0000 | ORAL_TABLET | Freq: Every evening | ORAL | Status: DC | PRN

## 2017-12-06 MED ORDER — METHOCARBAMOL 500 MG PO TABS
500.0000 mg | ORAL_TABLET | Freq: Four times a day (QID) | ORAL | Status: DC | PRN
  Administered 2017-12-06 – 2017-12-10 (×10): 500 mg via ORAL
  Filled 2017-12-06 (×10): qty 1

## 2017-12-06 MED ORDER — LATANOPROST 0.005 % OP SOLN
1.0000 [drp] | Freq: Every day | OPHTHALMIC | Status: DC
  Administered 2017-12-06 – 2017-12-09 (×4): 1 [drp] via OPHTHALMIC
  Filled 2017-12-06: qty 2.5

## 2017-12-06 MED ORDER — POLYVINYL ALCOHOL 1.4 % OP SOLN
1.0000 [drp] | OPHTHALMIC | Status: DC | PRN
  Filled 2017-12-06: qty 15

## 2017-12-06 MED ORDER — HYDROMORPHONE HCL 1 MG/ML IJ SOLN: 1.0000 mg | INTRAMUSCULAR | Status: DC | PRN

## 2017-12-06 MED ORDER — ENOXAPARIN SODIUM 30 MG/0.3ML ~~LOC~~ SOLN
30.0000 mg | SUBCUTANEOUS | Status: DC
  Administered 2017-12-06: 30 mg via SUBCUTANEOUS
  Filled 2017-12-06: qty 0.3

## 2017-12-06 MED ORDER — TRAMADOL HCL 50 MG PO TABS
50.0000 mg | ORAL_TABLET | Freq: Four times a day (QID) | ORAL | Status: DC | PRN
  Administered 2017-12-06 – 2017-12-07 (×3): 100 mg via ORAL
  Filled 2017-12-06 (×3): qty 2

## 2017-12-06 NOTE — ED Notes (Signed)
ED TO INPATIENT HANDOFF REPORT  Name/Age/Gender [name] [name] Staff 77 y.o. female  Code Status Code Status History    Date Active Date Inactive Code Status Order ID Comments User Context   10/17/2017 1512 10/18/2017 1350 Full Code [id number]  [name], MD Inpatient    Advance Directive Documentation     Most Recent Value  Type of Advance Directive  Healthcare Power of Attorney, Living will  Pre-existing out of facility DNR order (yellow form or pink MOST form)  -  "MOST" Form in Place?  -      Home/SNF/Other Home  Chief Complaint fall / left side hip pain   Level of Care/Admitting Diagnosis ED Disposition    ED Disposition Condition [place]: [place] [100102]  Level of Care: Telemetry [5]  Admit to tele based on following criteria: Other see comments  Comments: sinus tachycardia  Diagnosis: S/p left hip fracture [[id number]]  Admitting Physician: [name] [[id number]]  Attending Physician: [name] [phone]  Estimated length of stay: past midnight tomorrow  Certification:: I certify this patient will need inpatient services for at least 2 midnights  PT Class (Do Not Modify): Inpatient [101]  PT Acc Code (Do Not Modify): Private [1]       Medical History Past Medical History:  Diagnosis Date  . Allergy    hay fever  . Arthritis    osteoarthritis  . Cataract   . Difficult intubation    CHOLECYSTECTOMY 2003, NO SURGERY SINCE, "LITTLE DIFFICULT TO INTUBATE " PER PT  . Fibrocystic breast disease    resolved off caffeine & nicotine  . GERD (gastroesophageal reflux disease)    OCC  . Glaucoma    Dr Hillis Range  . Glaucoma    severe both eyes  . Heart murmur    2013 OCCASIONAL PAC's  . Osteopenia   . Other and unspecified hyperlipidemia   . 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 Vitamin D deficiency HX OF    Allergies Allergies  Allergen Reactions  . Bee Venom Anaphylaxis    Highly  allergic to [employer]   . Aspirin Other (See Comments)    STOMACH UPSET  . Latex Dermatitis    Powder in latex  . Other     NITROUS OXIDE =NAUSEA    IV Location/Drains/Wounds Patient Lines/Drains/Airways Status   Active Line/Drains/Airways    Name:   Placement date:   Placement time:   Site:   Days:   Peripheral IV 12/06/17 Right Forearm   12/06/17    1011    Forearm   less than 1   Incision (Closed) 10/17/17 Hip Left   10/17/17    1224     50          Labs/Imaging Results for orders placed or performed during the hospital encounter of 12/06/17 (from the past 48 hour(s))  Basic metabolic panel     Status: Abnormal   Collection Time: 12/06/17  9:50 AM  Result Value Ref Range   Sodium 141 135 - 145 mmol/L   Potassium 4.5 3.5 - 5.1 mmol/L   Chloride 112 (H) 98 - 111 mmol/L   CO2 22 22 - 32 mmol/L   Glucose, Bld 123 (H) 70 - 99 mg/dL   BUN 17 8 - 23 mg/dL   Creatinine, Ser 0.71 0.44 - 1.00 mg/dL   Calcium 9.4 8.9 - 10.3 mg/dL   GFR calc non  Af Amer >60 >60 mL/min   GFR calc Af Amer >60 >60 mL/min   Anion gap 7 5 - 15    Comment: Performed at [facility], [place] [address] [place], [place] [address]  CBC with Differential     Status: Abnormal   Collection Time: 12/06/17  9:50 AM  Result Value Ref Range   WBC 9.0 4.0 - 10.5 K/uL   RBC 4.42 3.87 - 5.11 MIL/uL   Hemoglobin 13.4 12.0 - 15.0 g/dL   HCT 43.0 36.0 - 46.0 %   MCV 97.3 80.0 - 100.0 fL   MCH 30.3 26.0 - 34.0 pg   MCHC 31.2 30.0 - 36.0 g/dL   RDW 13.1 11.5 - 15.5 %   Platelets 249 150 - 400 K/uL   nRBC 0.0 0.0 - 0.2 %   Neutrophils Relative % 85 %   Neutro Abs 7.7 1.7 - 7.7 K/uL   Lymphocytes Relative 7 %   Lymphs Abs 0.6 (L) 0.7 - 4.0 K/uL   Monocytes Relative 7 %   Monocytes Absolute 0.6 0.1 - 1.0 K/uL   Eosinophils Relative 0 %   Eosinophils Absolute 0.0 0.0 - 0.5 K/uL   Basophils Relative 0 %   Basophils Absolute 0.0 0.0 - 0.1 K/uL   Immature Granulocytes 1 %   Abs Immature  Granulocytes 0.08 (H) 0.00 - 0.07 K/uL    Comment: Performed at [facility], [place] [address] [place], [place] [address]   Dg Hip Unilat W Or Wo Pelvis 1 View Left  Result Date: 12/06/2017 CLINICAL DATA:  Pain after fall EXAM: DG HIP (WITH OR WITHOUT PELVIS) 1V*L* COMPARISON:  None. FINDINGS: There is a fracture surrounding the femoral component of the left hip replacement hardware. There is mild displacement. The acetabular component remains in place. The right hip and pelvic bones are unremarkable. IMPRESSION: Mildly displaced fracture of the proximal femur adjacent to the femoral component of the hip replacement. Electronically Signed   By: Dorise Bullion III M.D   On: 12/06/2017 09:53   Dg Femur Min 2 Views Left  Result Date: 12/06/2017 CLINICAL DATA:  Pain after fall EXAM: LEFT FEMUR 2 VIEWS COMPARISON:  None. FINDINGS: There is a fracture in the proximal femur surrounding the femoral component of a hip replacement. No other fractures identified. IMPRESSION: Fracture of the proximal femur surrounding the femoral component of a hip replacement. See the pelvis/left hip films for further evaluation. Electronically Signed   By: Dorise Bullion III M.D   On: 12/06/2017 09:50   EKG Interpretation  Date/Time:  Thursday December 06 2017 10:04:09 EST Ventricular Rate:  105 PR Interval:    QRS Duration: 81 QT Interval:  336 QTC Calculation: 444 R Axis:   -33 Text Interpretation:  Sinus tachycardia Probable left atrial enlargement Left ventricular hypertrophy Anterior Q waves, possibly due to LVH Since last tracing rate faster Confirmed by Daleen Bo [id number]) on 12/06/2017 11:37:49 AM   Pending Labs Unresulted Labs (From admission, onward)   None      Vitals/Pain Today's Vitals   12/06/17 0840 12/06/17 1008 12/06/17 1012 12/06/17 1050  BP:  (!) 147/86  (!) 154/85  Pulse:  (!) 110  (!) 108  Resp:  13  (!) 22  Temp:      TempSrc:      SpO2:  99%  96%  Weight:  44.5 kg     Height: 5' (1.524 m)     PainSc: 10-Worst pain ever  10-Worst  pain ever     Isolation Precautions No active isolations  Medications Medications  fentaNYL (SUBLIMAZE) injection 50 mcg (50 mcg Intravenous Given 12/06/17 1013)  ondansetron (ZOFRAN) injection 4 mg (4 mg Intravenous Given 12/06/17 1013)    Mobility non-ambulatory

## 2017-12-06 NOTE — ED Triage Notes (Signed)
Patient presented to ed with c/o fall/left hip pain. Patient fell last night in her kitchen. Patient c/o 10/10 pain. Pt is unable to bear weight on left hip.

## 2017-12-06 NOTE — Consult Note (Signed)
Reason for Consult hip pain  Referring Physician: DR [name] [name] is an 77 y.o. female.  IRS:WNIO pleasant 77 yo WF had amechanical fall last night and was unable to bear weight on left. She undergone a sn uncomplicated left THR by Dr [name] [name] in October. Brought to [facility] and diagnosed with left periprosthetic femur fracture.  Past Medical History:  Diagnosis Date  . Allergy    hay fever  . Arthritis    osteoarthritis  . Cataract   . Difficult intubation    CHOLECYSTECTOMY 2003, NO SURGERY SINCE, "LITTLE DIFFICULT TO INTUBATE " PER PT  . Fibrocystic breast disease    resolved off caffeine & nicotine  . GERD (gastroesophageal reflux disease)    OCC  . Glaucoma    Dr [name] [name]  . Glaucoma    severe both eyes  . Heart murmur    2013 OCCASIONAL PAC's  . Osteopenia   . Other and unspecified hyperlipidemia   . 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 Vitamin D deficiency HX OF    Past Surgical History:  Procedure Laterality Date  . APPENDECTOMY  1970  . BREAST BIOPSY  1978   X 2   . BREAST EXCISIONAL BIOPSY Bilateral   . CESAREAN SECTION  1971  . CHOLECYSTECTOMY  2004   laparoscopic  . COLONOSCOPY  [id number], 2013   all Negative , Dr.Brodie. Due 2023  . DILATION AND CURETTAGE OF UTERUS  1972   twice  . EYE SURGERY Bilateral 2018  . MOUTH SURGERY  03/2015  . MYRINGOTOMY  1946   bilateral  . NASAL SEPTUM SURGERY  1965  . REFRACTIVE SURGERY Bilateral 12/2010   for Glaucoma, Dr [name] [name]  . TONSILLECTOMY  as child   and adenoids  . TOTAL HIP ARTHROPLASTY Left 10/17/2017   Procedure: LEFT TOTAL HIP ARTHROPLASTY ANTERIOR APPROACH;  Surgeon: [name] Arabian, MD;  Location: [facility];  Service: Orthopedics;  Laterality: Left;  . TUBAL LIGATION      Family History  Problem Relation Age of Onset  . Colon cancer Father 63  . Parkinsonism Father   . Prostate cancer Father   . Glaucoma Father   . Osteopenia Mother   . Heart attack Mother 82       non smoker  .  Peripheral vascular disease Mother        Status post carotid endarterectomy  . Thyroid disease Sister        X 2 sisters  . Hypertension Sister        X 2 sisters  . Osteopenia Sister   . Hyperlipidemia Sister   . Arthritis Brother        OA  . Gout Brother   . Hyperlipidemia Brother   . Hypertension Brother   . Diabetes Paternal Aunt   . Hypertension Maternal Grandfather   . Heart attack Maternal Grandfather 43  . Aortic aneurysm Maternal Grandfather   . Heart attack Maternal Uncle        >55  . Diabetes Paternal Grandmother   . Cancer Paternal Grandmother        esophageal  . Pancreatic cancer Paternal Grandmother   . Coronary artery disease Maternal Grandmother   . Hypertension Maternal Grandmother   . Osteopenia Maternal Grandmother   . Lung disease Sister        fungal infection acquired  in [place]  . Osteoarthritis Sister   . Breast cancer Neg Hx  Social History:  reports that she quit smoking about 35 years ago. Her smoking use included cigarettes. She has a 20.00 pack-year smoking history. She has never used smokeless tobacco. She reports that she drinks about 5.0 standard drinks of alcohol per week. She reports that she does not use drugs.  Allergies:  Allergies  Allergen Reactions  . Bee Venom Anaphylaxis    Highly allergic to [employer]   . Aspirin Other (See Comments)    STOMACH UPSET  . Latex Dermatitis    Powder in latex  . Other     NITROUS OXIDE =NAUSEA    Medications: I have reviewed the patient's current medications.  Results for orders placed or performed during the hospital encounter of 12/06/17 (from the past 48 hour(s))  Basic metabolic panel     Status: Abnormal   Collection Time: 12/06/17  9:50 AM  Result Value Ref [name]   Sodium 141 135 - 145 mmol/L   Potassium 4.5 3.5 - 5.1 mmol/L   Chloride 112 (H) 98 - 111 mmol/L   CO2 22 22 - 32 mmol/L   Glucose, Bld 123 (H) 70 - 99 mg/dL   BUN 17 8 - 23 mg/dL   Creatinine, Ser 0.71 0.44 -  1.00 mg/dL   Calcium 9.4 8.9 - 10.3 mg/dL   GFR calc non Af Amer >60 >60 mL/min   GFR calc Af Amer >60 >60 mL/min   Anion gap 7 5 - 15    Comment: Performed at [facility], [place] [address] [place], [place] [address]  CBC with Differential     Status: Abnormal   Collection Time: 12/06/17  9:50 AM  Result Value Ref [name]   WBC 9.0 4.0 - 10.5 K/uL   RBC 4.42 3.87 - 5.11 MIL/uL   Hemoglobin 13.4 12.0 - 15.0 g/dL   HCT 43.0 36.0 - 46.0 %   MCV 97.3 80.0 - 100.0 fL   MCH 30.3 26.0 - 34.0 pg   MCHC 31.2 30.0 - 36.0 g/dL   RDW 13.1 11.5 - 15.5 %   Platelets 249 150 - 400 K/uL   nRBC 0.0 0.0 - 0.2 %   Neutrophils Relative % 85 %   Neutro Abs 7.7 1.7 - 7.7 K/uL   Lymphocytes Relative 7 %   Lymphs Abs 0.6 (L) 0.7 - 4.0 K/uL   Monocytes Relative 7 %   Monocytes Absolute 0.6 0.1 - 1.0 K/uL   Eosinophils Relative 0 %   Eosinophils Absolute 0.0 0.0 - 0.5 K/uL   Basophils Relative 0 %   Basophils Absolute 0.0 0.0 - 0.1 K/uL   Immature Granulocytes 1 %   Abs Immature Granulocytes 0.08 (H) 0.00 - 0.07 K/uL    Comment: Performed at [facility], [place] [address], [place], [place] [address]  Albumin     Status: None   Collection Time: 12/06/17  9:50 AM  Result Value Ref [name]   Albumin 4.5 3.5 - 5.0 g/dL    Comment: Performed at [facility], [place] [address] [place], [place] [address]  Calcium     Status: None   Collection Time: 12/06/17  9:50 AM  Result Value Ref [name]   Calcium 9.5 8.9 - 10.3 mg/dL    Comment: Performed at [facility], [place] [address] [place], [place] [address]    Dg Hip Malvin Johns Or Wo Pelvis 1 View Left  Result Date: 12/06/2017 CLINICAL DATA:  Pain after fall EXAM: DG HIP (  WITH OR WITHOUT PELVIS) 1V*L* COMPARISON:  None. FINDINGS: There is a fracture surrounding the femoral component of the left hip replacement hardware. There is mild displacement. The acetabular component remains in place. The  right hip and pelvic bones are unremarkable. IMPRESSION: Mildly displaced fracture of the proximal femur adjacent to the femoral component of the hip replacement. Electronically Signed   By: Dorise Bullion III M.D   On: 12/06/2017 09:53   Dg Femur Min 2 Views Left  Result Date: 12/06/2017 CLINICAL DATA:  Pain after fall EXAM: LEFT FEMUR 2 VIEWS COMPARISON:  None. FINDINGS: There is a fracture in the proximal femur surrounding the femoral component of a hip replacement. No other fractures identified. IMPRESSION: Fracture of the proximal femur surrounding the femoral component of a hip replacement. See the pelvis/left hip films for further evaluation. Electronically Signed   By: Dorise Bullion III M.D   On: 12/06/2017 09:50    ROS Blood pressure (!) 149/78, pulse 85, temperature 98.8 F (37.1 C), temperature source Oral, resp. rate 16, height 5' (1.524 m), weight 44.5 kg, SpO2 99 %. Physical ExamWDWN WF in NAD lying in bed obviously uncomfortable, Left LE normal lenghth no obvious deformity knee distal WNL  Severe pain left hip  with any movement. Distal NV grossly intact.     Assessment/Plan: Left periprosthetic femur fracture. Discussed with Dr [name] [name] and he will see in am. For now NPO after MN   Change pain med to IV Dilaudid add ice pack and muscle relaxer.. Type ans screen.   Discussed with patient . [name] [name] ANDREW 12/06/2017, 8:22 PM

## 2017-12-06 NOTE — H&P (Signed)
History and Physical  [name] [name] BDZ:[id number] DOB: 1940-07-02 DOA: 12/06/2017   PCP: Velna Hatchet, MD   Patient coming from: Home   Chief Complaint: Left hip pain after a fall  HPI: [name] [name] is a 77 y.o. 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  She landed on her left hip without hitting her head or losing consciousness. Her husband helped her up and got her upstairs. The pain in her leg started later during the night whenever she moved. She tried to get up this morning but couldn't bear weight to go to the bathroom. She called her orthopedics doctor on call who suggested she come to [facility] ED  She had been in her usual state of health prior to that. Denies any no fevers, chills, no chest pain. No dyspnea. No lightheadedness or dizziness.   She has been doing well since her recent left hip replacement. She has returned to the gym doing resistance work (about 20 lbs with her personal trainer since the procedure) and light training with no complications.   She was on her Xarelto for about 2 weeks after discharge for DVT prophylaxis.  She followed up by baby aspirin for the remaining week.  She reports intolerance to higher dose of aspirin with severe abdominal pain.  She has been off both agents since 11/21   History of Osteoporosis. She had been on fosamax for about 5 years. Evista was added in 2010-2011. She hasn't been on anything other than vitamin D and calcium. Her mother and sister both have osteopenia. No history of chronic prednisone therapy.    ED Course: Afebrile, respiratory rate 22, heart rate 106-110, blood pressure 140/88-134/85. CBC was grossly unremarkable.  BMP noted for glucose of 123. X-ray of  femur show proximal femur fracture surrounding femoral component of hip replacement X-ray of hip (left)/pelvis-mildly displaced fracture of the proximal femur adjacent to the femoral component of hip replacement. Patient was given IV fentanyl 50 mcg and IV Zofran.  EDP discussed care with orthopedic physician (Dr. Theda Sers). [facility] was called for further management.  Review of Systems: As mentioned in the history of present illness.Review of systems are otherwise negative Patient seen in the ED.   Past Medical History:  Diagnosis Date  . Allergy    hay fever  . Arthritis    osteoarthritis  . Cataract   . Difficult intubation    CHOLECYSTECTOMY 2003, NO SURGERY SINCE, "LITTLE DIFFICULT TO INTUBATE " PER PT  . Fibrocystic breast disease    resolved off caffeine & nicotine  . GERD (gastroesophageal reflux disease)    OCC  . Glaucoma    Dr Hillis Range  . Glaucoma    severe both eyes  . Heart murmur    2013 OCCASIONAL PAC's  . Osteopenia   . Other and unspecified hyperlipidemia   . 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 Vitamin D deficiency HX OF   Past Surgical History:  Procedure Laterality Date  . APPENDECTOMY  1970  . BREAST BIOPSY  1978   X 2   . BREAST EXCISIONAL BIOPSY Bilateral   . CESAREAN SECTION  1971  . CHOLECYSTECTOMY  2004   laparoscopic  . COLONOSCOPY  3419,6222, 2013  all Negative , Dr.Brodie. Due 2023  . DILATION AND CURETTAGE OF UTERUS  1972   twice  . EYE SURGERY Bilateral 2018  . MOUTH SURGERY  03/2015  . MYRINGOTOMY  1946   bilateral  . NASAL SEPTUM SURGERY  1965  . REFRACTIVE SURGERY Bilateral 12/2010   for Glaucoma, Dr Bing Plume  . TONSILLECTOMY  as child   and adenoids  . TOTAL HIP ARTHROPLASTY Left 10/17/2017   Procedure: LEFT TOTAL HIP ARTHROPLASTY ANTERIOR APPROACH;  Surgeon: [name], MD;  Location: [facility];  Service: Orthopedics;  Laterality: Left;  . TUBAL LIGATION     Allergies  Allergen Reactions  . Bee Venom  Anaphylaxis    Highly allergic to [employer]   . Aspirin Other (See Comments)    STOMACH UPSET  . Latex Dermatitis    Powder in latex  . Other     NITROUS OXIDE =NAUSEA   Social History:  reports that she quit smoking about 35 years ago. Her smoking use included cigarettes. She has a 20.00 pack-year smoking history. She has never used smokeless tobacco. She reports that she drinks about 5.0 standard drinks of alcohol per week. She reports that she does not use drugs. Family History  Problem Relation Age of Onset  . Colon cancer Father 51  . Parkinsonism Father   . Prostate cancer Father   . Glaucoma Father   . Osteopenia Mother   . Heart attack Mother 90       non smoker  . Peripheral vascular disease Mother        Status post carotid endarterectomy  . Thyroid disease Sister        X 2 sisters  . Hypertension Sister        X 2 sisters  . Osteopenia Sister   . Hyperlipidemia Sister   . Arthritis Brother        OA  . Gout Brother   . Hyperlipidemia Brother   . Hypertension Brother   . Diabetes Paternal Aunt   . Hypertension Maternal Grandfather   . Heart attack Maternal Grandfather 43  . Aortic aneurysm Maternal Grandfather   . Heart attack Maternal Uncle        >55  . Diabetes Paternal Grandmother   . Cancer Paternal Grandmother        esophageal  . Pancreatic cancer Paternal Grandmother   . Coronary artery disease Maternal Grandmother   . Hypertension Maternal Grandmother   . Osteopenia Maternal Grandmother   . Lung disease Sister        fungal infection acquired  in [place]  . Osteoarthritis Sister   . Breast cancer Neg Hx       Prior to Admission medications   Medication Sig Start Date End Date Taking? Authorizing Provider  Calcium Citrate-Vitamin D (CALCIUM + D PO) Take 1 tablet by mouth daily.   Yes [provider]  methocarbamol (ROBAXIN) 500 MG tablet Take 1 tablet (500 mg total) by mouth every 6 (six) hours as needed for muscle spasms. 10/18/17   Yes Edmisten, Kristie L, PA  Multiple Vitamins-Calcium (ONE-A-DAY WOMENS PO) Take by mouth.   Yes [provider]  Multiple Vitamins-Minerals (EYE VITAMINS PO) Take by mouth.   Yes [provider]  OVER THE COUNTER MEDICATION Take 1 tablet by mouth daily. Q-Nol   Yes [provider]  Polyethyl Glycol-Propyl Glycol (SYSTANE OP) Apply 1 drop to eye daily as needed (dry eye).   Yes [provider]  spironolactone (  ALDACTONE) 25 MG tablet Take 1 tablet by mouth  daily Patient taking differently: Take 25 mg by mouth every evening.  07/21/15  Yes Roma Schanz R, DO  travoprost, benzalkonium, (TRAVATAN) 0.004 % ophthalmic solution Place 1 drop into both eyes at bedtime.    Yes [provider]  dorzolamide (TRUSOPT) 2 % ophthalmic solution Place 1 drop into both eyes 2 (two) times daily.    [provider]  EPINEPHrine (EPI-PEN) 0.3 mg/0.3 mL DEVI Inject 0.3 mLs (0.3 mg total) into the muscle once. 06/05/12   Hendricks Limes, MD  HYDROcodone-acetaminophen (NORCO/VICODIN) 5-325 MG tablet Take 1-2 tablets by mouth every 6 (six) hours as needed for severe pain (pain score 4-6). Patient not taking: Reported on 12/06/2017 10/18/17   Derl Barrow, PA  rivaroxaban (XARELTO) 10 MG TABS tablet Take 1 tablet (10 mg total) by mouth daily with breakfast for 19 days. Patient not taking: Reported on 12/06/2017 10/19/17 11/07/17  Derl Barrow, PA  simvastatin (ZOCOR) 20 MG tablet Take 1 tablet by mouth at  bedtime Patient not taking: Reported on 12/06/2017 07/21/15   Carollee Herter, Alferd Apa, DO  traMADol (ULTRAM) 50 MG tablet Take 1-2 tablets (50-100 mg total) by mouth every 6 (six) hours as needed for moderate pain. Patient not taking: Reported on 12/06/2017 10/18/17   Derl Barrow, PA    Physical Exam: BP (!) 144/97 (BP Location: Left Arm)   Pulse (!) 108   Temp 98.3 F (36.8 C) (Oral)   Resp 16   Ht 5' (1.524 m)   Wt 44.5 kg    SpO2 98%   BMI 19.14 kg/m   Constitutional female, looks younger than stated age, no acute distress Eyes: EOMI, anicteric, normal conjunctivae ENMT: Dry oral mucosa  Neck: FROM,  Cardiovascular: Tachycardic,?  Systolic murmur loudest at left lower sternal border Respiratory: Normal respiratory effort on room air, clear breath sounds  Abdomen: Soft,non-tender, normal bowel sounds Skin: No rash ulcers, or lesions. Without skin tenting MSK: Shortened left leg, immobile due to pain, sensation still intact Neurologic: Grossly no focal neuro deficit. Psychiatric:Appropriate affect, and mood. Mental status AAOx3          Labs on Admission:  Basic Metabolic Panel: Recent Labs  Lab 12/06/17 0950  NA 141  K 4.5  CL 112*  CO2 22  GLUCOSE 123*  BUN 17  CREATININE 0.71  CALCIUM 9.4   Liver Function Tests: No results for input(s): AST, ALT, ALKPHOS, BILITOT, PROT, ALBUMIN in the last 168 hours. No results for input(s): LIPASE, AMYLASE in the last 168 hours. No results for input(s): AMMONIA in the last 168 hours. CBC: Recent Labs  Lab 12/06/17 0950  WBC 9.0  NEUTROABS 7.7  HGB 13.4  HCT 43.0  MCV 97.3  PLT 249   Cardiac Enzymes: No results for input(s): CKTOTAL, CKMB, CKMBINDEX, TROPONINI in the last 168 hours.  BNP (last 3 results) No results for input(s): BNP in the last 8760 hours.  ProBNP (last 3 results) No results for input(s): PROBNP in the last 8760 hours.  CBG: No results for input(s): GLUCAP in the last 168 hours.  Radiological Exams on Admission: Dg Hip Unilat W Or Wo Pelvis 1 View Left  Result Date: 12/06/2017 CLINICAL DATA:  Pain after fall EXAM: DG HIP (WITH OR WITHOUT PELVIS) 1V*L* COMPARISON:  None. FINDINGS: There is a fracture surrounding the femoral component of the left hip replacement hardware. There is mild displacement. The acetabular component remains in  place. The right hip and pelvic bones are unremarkable. IMPRESSION: Mildly displaced  fracture of the proximal femur adjacent to the femoral component of the hip replacement. Electronically Signed   By: Dorise Bullion III M.D   On: 12/06/2017 09:53   Dg Femur Min 2 Views Left  Result Date: 12/06/2017 CLINICAL DATA:  Pain after fall EXAM: LEFT FEMUR 2 VIEWS COMPARISON:  None. FINDINGS: There is a fracture in the proximal femur surrounding the femoral component of a hip replacement. No other fractures identified. IMPRESSION: Fracture of the proximal femur surrounding the femoral component of a hip replacement. See the pelvis/left hip films for further evaluation. Electronically Signed   By: Dorise Bullion III M.D   On: 12/06/2017 09:50    EKG: Independently reviewed. sinus tachycardia.  Assessment/Plan Present on Admission: . OA (osteoarthritis) of hip . Hyperlipidemia . Vitamin D deficiency . IBS  Active Problems:   Vitamin D deficiency   Hyperlipidemia   IBS   OA (osteoarthritis) of hip   S/p left hip fracture   Left proximal femur fracture related to mechanical fall, status post recent left hip total arthroplasty Fracture close to femoral component of recent ipsilateral total hip replacement Stemmed from mechanical fall No concern for syncope -Ortho consulted by EDP, awaiting further recommendations -IV antiemetics, pain control -N.p.o. potential procedure on 11/29 -Subcutaneous Lovenox prophylaxis (previously completed Xarelto followed by baby aspirin as an outpatient for recent total hip arthroplasty)   Elevated BP SBP in the 140s to 150s Likely elevated in setting of pain No formal diagnosis of hypertension -Closely monitor, IV hydralazine SBP greater than 180, DBP greater than 100  Sinus tachycardia Confirmed on EKG Suspect related to pain and dehydration from diminished p.o. intake - Normal saline 75 cc an hour x24 hours, reassess -Monitor on telemetry  Hyperlipidemia Has been off statin since June bc of improvement in LDL per her  report  Osteoarthritis left hip, s/p Total hip arthroplasty on 10/17/2017, by Dr. [name] Previous DVT prophylaxis, Xarelto completed  Lovenox prophylaxis here  Dry eyes Eyedrops nonformulary, family will bring home med  Glaucoma, stable Continue Travatan and Dorzolamide  History of IBS No current abdominal pain No home meds  Reports history of osteoporosis History of Fosamax and raloxifene in the past Given left hip fracture after ground-level fall, consistent with osteoporosis -Follow vitamin D level  DVT prophylaxis: Lovenox  Code Status: Full code, discussed on day of admission  Family Communication: No family at bedside   Consults called: Orthopedics (EDP called)  Admission status: Admitted as inpatient, expect greater than 2 midnight stay for left hip fracture in setting of recent total hip arthroplasty on ipsilateral side, will need pain control, improvement in sinus tachycardia, PT evaluation and case management social work assistance.    [name] MD [facility]  Pager [phone]  If 7PM-7AM, please contact night-coverage [email or web address] Password [facility]  12/06/2017, 12:22 PM

## 2017-12-06 NOTE — ED Provider Notes (Signed)
[facility] Provider Note   CSN: [id number] Arrival date & time: 12/06/17  7169     History   Chief Complaint No chief complaint on file.   HPI [name] [name] is a 77 y.o. female.  HPI   She complains of fall yesterday, while in her kitchen.  She thinks that while she was walking in her kitchen she tripped on a chair, which caused her to fall.  She did not lose consciousness.  She was able to get off the floor with help, then was able to climb stairs and sleep normally last night.  Today her left leg was hurting, so she came here by private vehicle for evaluation.  She had a left hip prosthesis placed, about a month ago for arthritis.  She denies injury to head, neck, arms or right leg.  She did not eat this morning.  There are no other known modifying factors.  Past Medical History:  Diagnosis Date  . Allergy    hay fever  . Arthritis    osteoarthritis  . Cataract   . Difficult intubation    CHOLECYSTECTOMY 2003, NO SURGERY SINCE, "LITTLE DIFFICULT TO INTUBATE " PER PT  . Fibrocystic breast disease    resolved off caffeine & nicotine  . GERD (gastroesophageal reflux disease)    OCC  . Glaucoma    Dr Hillis Range  . Glaucoma    severe both eyes  . Heart murmur    2013 OCCASIONAL PAC's  . Osteopenia   . Other and unspecified hyperlipidemia   . 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 Vitamin D deficiency HX OF    Patient Active Problem List   Diagnosis Date Noted  . S/p left hip fracture 12/06/2017  . OA (osteoarthritis) of hip 10/17/2017  . Glaucoma 05/26/2013  . Edema 05/16/2012  . OSTEOARTHRITIS 09/11/2008  . Vitamin D deficiency 04/16/2008  . REACTIVE AIRWAY DISEASE 04/16/2008  . Hyperlipidemia 09/02/2007  . 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 IBS 04/16/2006  . Osteopenia 04/16/2006  . HX, PERSONAL, TUBERCULOSIS 04/16/2006    Past Surgical History:  Procedure Laterality Date  . APPENDECTOMY  1970  . BREAST  BIOPSY  1978   X 2   . BREAST EXCISIONAL BIOPSY Bilateral   . CESAREAN SECTION  1971  . CHOLECYSTECTOMY  2004   laparoscopic  . COLONOSCOPY  [id number], 2013   all Negative , Dr.Brodie. Due 2023  . DILATION AND CURETTAGE OF UTERUS  1972   twice  . EYE SURGERY Bilateral 2018  . MOUTH SURGERY  03/2015  . MYRINGOTOMY  1946   bilateral  . NASAL SEPTUM SURGERY  1965  . REFRACTIVE SURGERY Bilateral 12/2010   for Glaucoma, Dr Bing Plume  . TONSILLECTOMY  as child   and adenoids  . TOTAL HIP ARTHROPLASTY Left 10/17/2017   Procedure: LEFT TOTAL HIP ARTHROPLASTY ANTERIOR APPROACH;  Surgeon: [name], MD;  Location: [facility];  Service: Orthopedics;  Laterality: Left;  . TUBAL LIGATION       OB History   None      Home Medications    Prior to Admission medications   Medication Sig Start Date End Date Taking? Authorizing Provider  Calcium Citrate-Vitamin D (CALCIUM + D PO) Take 1 tablet by mouth daily.   Yes [provider]  methocarbamol (ROBAXIN) 500 MG tablet Take 1 tablet (500 mg total) by mouth every 6 (six) hours as needed for muscle spasms. 10/18/17  Yes Edmisten, Kristie L, PA  Multiple Vitamins-Calcium (ONE-A-DAY WOMENS PO) Take by mouth.   Yes [provider]  Multiple Vitamins-Minerals (EYE VITAMINS PO) Take by mouth.   Yes [provider]  OVER THE COUNTER MEDICATION Take 1 tablet by mouth daily. Q-Nol   Yes [provider]  Polyethyl Glycol-Propyl Glycol (SYSTANE OP) Apply 1 drop to eye daily as needed (dry eye).   Yes [provider]  spironolactone (ALDACTONE) 25 MG tablet Take 1 tablet by mouth  daily Patient taking differently: Take 25 mg by mouth every evening.  07/21/15  Yes Roma Schanz R, DO  travoprost, benzalkonium, (TRAVATAN) 0.004 % ophthalmic solution Place 1 drop into both eyes at bedtime.    Yes [provider]  dorzolamide (TRUSOPT) 2 % ophthalmic solution Place 1 drop into both eyes 2 (two) times  daily.    [provider]  EPINEPHrine (EPI-PEN) 0.3 mg/0.3 mL DEVI Inject 0.3 mLs (0.3 mg total) into the muscle once. 06/05/12   Hendricks Limes, MD  HYDROcodone-acetaminophen (NORCO/VICODIN) 5-325 MG tablet Take 1-2 tablets by mouth every 6 (six) hours as needed for severe pain (pain score 4-6). Patient not taking: Reported on 12/06/2017 10/18/17   Derl Barrow, PA  rivaroxaban (XARELTO) 10 MG TABS tablet Take 1 tablet (10 mg total) by mouth daily with breakfast for 19 days. Patient not taking: Reported on 12/06/2017 10/19/17 11/07/17  Derl Barrow, PA  simvastatin (ZOCOR) 20 MG tablet Take 1 tablet by mouth at  bedtime Patient not taking: Reported on 12/06/2017 07/21/15   Carollee Herter, Alferd Apa, DO  traMADol (ULTRAM) 50 MG tablet Take 1-2 tablets (50-100 mg total) by mouth every 6 (six) hours as needed for moderate pain. Patient not taking: Reported on 12/06/2017 10/18/17   Derl Barrow, PA    Family History Family History  Problem Relation Age of Onset  . Colon cancer Father 24  . Parkinsonism Father   . Prostate cancer Father   . Glaucoma Father   . Osteopenia Mother   . Heart attack Mother 1       non smoker  . Peripheral vascular disease Mother        Status post carotid endarterectomy  . Thyroid disease Sister        X 2 sisters  . Hypertension Sister        X 2 sisters  . Osteopenia Sister   . Hyperlipidemia Sister   . Arthritis Brother        OA  . Gout Brother   . Hyperlipidemia Brother   . Hypertension Brother   . Diabetes Paternal Aunt   . Hypertension Maternal Grandfather   . Heart attack Maternal Grandfather 43  . Aortic aneurysm Maternal Grandfather   . Heart attack Maternal Uncle        >55  . Diabetes Paternal Grandmother   . Cancer Paternal Grandmother        esophageal  . Pancreatic cancer Paternal Grandmother   . Coronary artery disease Maternal Grandmother   . Hypertension Maternal Grandmother   . Osteopenia Maternal  Grandmother   . Lung disease Sister        fungal infection acquired  in [place]  . Osteoarthritis Sister   . Breast cancer Neg Hx     Social History Social History   Tobacco Use  . Smoking status: Former Smoker    Packs/day: 1.00    Years: 20.00    Pack years: 20.00  Types: Cigarettes    Last attempt to quit: 01/09/1982    Years since quitting: 35.9  . Smokeless tobacco: Never Used  . Tobacco comment: smoked 1958-1984, up to 1 ppd  Substance Use Topics  . Alcohol use: Yes    Alcohol/week: 5.0 standard drinks    Types: 5 Glasses of wine per week    Comment: glass win e per day   . Drug use: Never     Allergies   Bee venom; Aspirin; Latex; and Other   Review of Systems Review of Systems  All other systems reviewed and are negative.    Physical Exam Updated Vital Signs BP (!) 154/85 (BP Location: Left Arm)   Pulse (!) 108   Temp 98.3 F (36.8 C) (Oral)   Resp (!) 22   Ht 5' (1.524 m)   Wt 44.5 kg   SpO2 96%   BMI 19.14 kg/m   Physical Exam  Constitutional: She is oriented to person, place, and time. She appears well-developed and well-nourished.  HENT:  Head: Normocephalic and atraumatic.  Eyes: Pupils are equal, round, and reactive to light. Conjunctivae and EOM are normal.  Neck: Normal range of motion and phonation normal. Neck supple.  Cardiovascular: Normal rate.  Pulmonary/Chest: Effort normal. She exhibits no tenderness.  Musculoskeletal:  She guards against movement of the left hip secondary to left proximal femur discomfort.  No external rotation or left leg shortening.  Neurovascular intact distally in the left foot.  Neurological: She is alert and oriented to person, place, and time. She exhibits normal muscle tone.  Skin: Skin is warm and dry.  Psychiatric: She has a normal mood and affect. Her behavior is normal. Judgment and thought content normal.  Nursing note and vitals reviewed.    ED Treatments / Results  Labs (all labs ordered are  listed, but only abnormal results are displayed) Labs Reviewed  BASIC METABOLIC PANEL - Abnormal; Notable for the following components:      Result Value   Chloride 112 (*)    Glucose, Bld 123 (*)    All other components within normal limits  CBC WITH DIFFERENTIAL/PLATELET - Abnormal; Notable for the following components:   Lymphs Abs 0.6 (*)    Abs Immature Granulocytes 0.08 (*)    All other components within normal limits    EKG EKG Interpretation  Date/Time:  Thursday December 06 2017 10:04:09 EST Ventricular Rate:  105 PR Interval:    QRS Duration: 81 QT Interval:  336 QTC Calculation: 444 R Axis:   -33 Text Interpretation:  Sinus tachycardia Probable left atrial enlargement Left ventricular hypertrophy Anterior Q waves, possibly due to LVH Since last tracing rate faster Confirmed by Daleen Bo [phone]) on 12/06/2017 11:37:49 AM   Radiology Dg Hip Unilat W Or Wo Pelvis 1 View Left  Result Date: 12/06/2017 CLINICAL DATA:  Pain after fall EXAM: DG HIP (WITH OR WITHOUT PELVIS) 1V*L* COMPARISON:  None. FINDINGS: There is a fracture surrounding the femoral component of the left hip replacement hardware. There is mild displacement. The acetabular component remains in place. The right hip and pelvic bones are unremarkable. IMPRESSION: Mildly displaced fracture of the proximal femur adjacent to the femoral component of the hip replacement. Electronically Signed   By: Dorise Bullion III M.D   On: 12/06/2017 09:53   Dg Femur Min 2 Views Left  Result Date: 12/06/2017 CLINICAL DATA:  Pain after fall EXAM: LEFT FEMUR 2 VIEWS COMPARISON:  None. FINDINGS: There is a fracture in  the proximal femur surrounding the femoral component of a hip replacement. No other fractures identified. IMPRESSION: Fracture of the proximal femur surrounding the femoral component of a hip replacement. See the pelvis/left hip films for further evaluation. Electronically Signed   By: Dorise Bullion III M.D   On:  12/06/2017 09:50    Procedures Procedures (including critical care time)  Medications Ordered in ED Medications  fentaNYL (SUBLIMAZE) injection 50 mcg (50 mcg Intravenous Given 12/06/17 1013)  ondansetron (ZOFRAN) injection 4 mg (4 mg Intravenous Given 12/06/17 1013)     Initial Impression / Assessment and Plan / ED Course  I have reviewed the triage vital signs and the nursing notes.  Pertinent labs & imaging results that were available during my care of the patient were reviewed by me and considered in my medical decision making (see chart for details).  Clinical Course as of Dec 07 1139  Thu Dec 06, 2017  2353 Periprosthetic left femur fracture.  Mildly displaced.  Images reviewed by me.  DG Hip Unilat W or Wo Pelvis 1 View Left [EW]  1009 Case discussed with orthopedics, Dr. Theda Sers.  He will see the patient as a Optometrist, and arrange for operative management, tomorrow.   [EW]  1135 Normal  Basic metabolic panel(!) [EW]  6144 Normal  CBC with Differential(!) [EW]    Clinical Course User Index [EW] Daleen Bo, MD     Patient Vitals for the past 24 hrs:  BP Temp Temp src Pulse Resp SpO2 Height Weight  12/06/17 1050 (!) 154/85 - - (!) 108 (!) 22 96 % - -  12/06/17 1008 (!) 147/86 - - (!) 110 13 99 % - -  12/06/17 0840 - - - - - - 5' (1.524 m) 44.5 kg  12/06/17 0839 140/88 98.3 F (36.8 C) Oral (!) 106 14 - - -    11:34 AM Reevaluation with update and discussion. After initial assessment and treatment, an updated evaluation reveals she remains fairly comfortable.  Findings discussed with the patient and all questions were answered. Daleen Bo   Medical Decision Making: Fall, likely mechanical, with periprosthetic fracture left hip region.  This is an unstable injury and will require operative management.  Screening evaluation in the ED, reassuring without signs or symptoms of infectious or metabolic disorders.  Patient will will require hospitalization, for  management.  CRITICAL CARE-no Performed by: Daleen Bo  Nursing Notes Reviewed/ Care Coordinated Applicable Imaging Reviewed Interpretation of Laboratory Data incorporated into ED treatment  11:10 AM-Consult complete with hospitalist. Patient case explained and discussed.  She agrees to admit patient for further evaluation and treatment. Call ended at 11:30 AM  Plan: Admit  Final Clinical Impressions(s) / ED Diagnoses   Final diagnoses:  Closed fracture of left femur, unspecified fracture morphology, unspecified portion of femur, initial encounter [facility])    ED Discharge Orders    None       Daleen Bo, MD 12/06/17 1142

## 2017-12-07 ENCOUNTER — Inpatient Hospital Stay (HOSPITAL_COMMUNITY): Payer: Medicare Other

## 2017-12-07 ENCOUNTER — Inpatient Hospital Stay (HOSPITAL_COMMUNITY): Payer: Medicare Other | Admitting: Anesthesiology

## 2017-12-07 ENCOUNTER — Encounter (HOSPITAL_COMMUNITY)
Admission: EM | Disposition: A | Discharge status: Home Health | Payer: Self-pay | Source: Home / Self Care | Attending: Internal Medicine

## 2017-12-07 DIAGNOSIS — S72402A Unspecified fracture of lower end of left femur, initial encounter for closed fracture: Secondary | ICD-10-CM | POA: Diagnosis not present

## 2017-12-07 HISTORY — PX: ORIF FEMUR FRACTURE: SHX2119

## 2017-12-07 LAB — CBC
HCT: 38.9 % (ref 36.0–46.0)
Hemoglobin: 11.7 g/dL — ABNORMAL LOW (ref 12.0–15.0)
MCH: 30.7 pg (ref 26.0–34.0)
MCHC: 30.1 g/dL (ref 30.0–36.0)
MCV: 102.1 fL — ABNORMAL HIGH (ref 80.0–100.0)
Platelets: 202 10*3/uL (ref 150–400)
RBC: 3.81 MIL/uL — ABNORMAL LOW (ref 3.87–5.11)
RDW: 13.3 % (ref 11.5–15.5)
WBC: 6.5 10*3/uL (ref 4.0–10.5)
nRBC: 0 % (ref 0.0–0.2)

## 2017-12-07 LAB — BASIC METABOLIC PANEL
Anion gap: 4 — ABNORMAL LOW (ref 5–15)
BUN: 13 mg/dL (ref 8–23)
CO2: 26 mmol/L (ref 22–32)
CREATININE: 0.74 mg/dL (ref 0.44–1.00)
Calcium: 8.7 mg/dL — ABNORMAL LOW (ref 8.9–10.3)
Chloride: 111 mmol/L (ref 98–111)
GFR calc non Af Amer: >60 mL/min (ref >60)
Glucose, Bld: 104 mg/dL — ABNORMAL HIGH (ref 70–99)
Potassium: 4.2 mmol/L (ref 3.5–5.1)
Sodium: 141 mmol/L (ref 135–145)

## 2017-12-07 SURGERY — OPEN REDUCTION INTERNAL FIXATION (ORIF) DISTAL FEMUR FRACTURE
Anesthesia: Spinal | Laterality: Left

## 2017-12-07 MED ORDER — PHENOL 1.4 % MT LIQD: 1.0000 | OROMUCOSAL | Status: DC | PRN

## 2017-12-07 MED ORDER — ONDANSETRON HCL 4 MG/2ML IJ SOLN
INTRAMUSCULAR | Status: DC | PRN
  Administered 2017-12-07: 4 mg via INTRAVENOUS

## 2017-12-07 MED ORDER — FENTANYL CITRATE (PF) 100 MCG/2ML IJ SOLN
INTRAMUSCULAR | Status: DC | PRN
  Administered 2017-12-07: 50 ug via INTRAVENOUS

## 2017-12-07 MED ORDER — PROPOFOL 500 MG/50ML IV EMUL
INTRAVENOUS | Status: DC | PRN
  Administered 2017-12-07: 75 ug/kg/min via INTRAVENOUS

## 2017-12-07 MED ORDER — PROPOFOL 10 MG/ML IV BOLUS
INTRAVENOUS | Status: AC
  Filled 2017-12-07: qty 20

## 2017-12-07 MED ORDER — HYDROCODONE-ACETAMINOPHEN 5-325 MG PO TABS: 1.0000 | ORAL_TABLET | Freq: Four times a day (QID) | ORAL | 0 refills | Status: DC | PRN

## 2017-12-07 MED ORDER — BISACODYL 10 MG RE SUPP: 10.0000 mg | Freq: Every day | RECTAL | Status: DC | PRN

## 2017-12-07 MED ORDER — CEFAZOLIN SODIUM-DEXTROSE 2-4 GM/100ML-% IV SOLN
2.0000 g | Freq: Once | INTRAVENOUS | Status: AC
  Administered 2017-12-07: 2 g via INTRAVENOUS
  Filled 2017-12-07 (×2): qty 100

## 2017-12-07 MED ORDER — ONDANSETRON HCL 4 MG/2ML IJ SOLN: 4.0000 mg | Freq: Four times a day (QID) | INTRAMUSCULAR | Status: DC | PRN

## 2017-12-07 MED ORDER — OXYCODONE HCL 5 MG/5ML PO SOLN
5.0000 mg | Freq: Once | ORAL | Status: DC | PRN
  Filled 2017-12-07: qty 5

## 2017-12-07 MED ORDER — MEPERIDINE HCL 50 MG/ML IJ SOLN: 6.2500 mg | INTRAMUSCULAR | Status: DC | PRN

## 2017-12-07 MED ORDER — OXYCODONE HCL 5 MG PO TABS: 5.0000 mg | ORAL_TABLET | Freq: Once | ORAL | Status: DC | PRN

## 2017-12-07 MED ORDER — METHOCARBAMOL 500 MG PO TABS: 500.0000 mg | ORAL_TABLET | Freq: Four times a day (QID) | ORAL | 0 refills | Status: DC | PRN

## 2017-12-07 MED ORDER — SPIRONOLACTONE 25 MG PO TABS
25.0000 mg | ORAL_TABLET | Freq: Every evening | ORAL | Status: DC
  Administered 2017-12-07 – 2017-12-09 (×3): 25 mg via ORAL
  Filled 2017-12-07 (×4): qty 1

## 2017-12-07 MED ORDER — FLEET ENEMA 7-19 GM/118ML RE ENEM: 1.0000 | ENEMA | Freq: Once | RECTAL | Status: DC | PRN

## 2017-12-07 MED ORDER — RIVAROXABAN 10 MG PO TABS
10.0000 mg | ORAL_TABLET | Freq: Every day | ORAL | Status: DC
  Administered 2017-12-08 – 2017-12-10 (×3): 10 mg via ORAL
  Filled 2017-12-07 (×3): qty 1

## 2017-12-07 MED ORDER — FENTANYL CITRATE (PF) 100 MCG/2ML IJ SOLN
INTRAMUSCULAR | Status: AC
  Filled 2017-12-07: qty 2

## 2017-12-07 MED ORDER — TRAMADOL HCL 50 MG PO TABS: 50.0000 mg | ORAL_TABLET | Freq: Four times a day (QID) | ORAL | 0 refills | Status: DC | PRN

## 2017-12-07 MED ORDER — BUPIVACAINE IN DEXTROSE 0.75-8.25 % IT SOLN
INTRATHECAL | Status: DC | PRN
  Administered 2017-12-07: 1.6 mL via INTRATHECAL

## 2017-12-07 MED ORDER — RIVAROXABAN 10 MG PO TABS: 10.0000 mg | ORAL_TABLET | Freq: Every day | ORAL | 0 refills | Status: DC

## 2017-12-07 MED ORDER — HYDROCODONE-ACETAMINOPHEN 5-325 MG PO TABS
1.0000 | ORAL_TABLET | Freq: Four times a day (QID) | ORAL | Status: DC | PRN
  Administered 2017-12-07: 2 via ORAL
  Administered 2017-12-07 – 2017-12-08 (×3): 1 via ORAL
  Administered 2017-12-08: 2 via ORAL
  Administered 2017-12-09 (×2): 1 via ORAL
  Filled 2017-12-07 (×2): qty 1
  Filled 2017-12-07 (×2): qty 2
  Filled 2017-12-07 (×4): qty 1

## 2017-12-07 MED ORDER — HYDROMORPHONE HCL 1 MG/ML IJ SOLN
INTRAMUSCULAR | Status: AC
  Filled 2017-12-07: qty 1

## 2017-12-07 MED ORDER — LACTATED RINGERS IV SOLN
INTRAVENOUS | Status: DC
  Administered 2017-12-07: 09:00:00 via INTRAVENOUS

## 2017-12-07 MED ORDER — PROPOFOL 500 MG/50ML IV EMUL
INTRAVENOUS | Status: DC | PRN
  Administered 2017-12-07 (×2): 10 mg via INTRAVENOUS
  Administered 2017-12-07: 20 mg via INTRAVENOUS

## 2017-12-07 MED ORDER — SODIUM CHLORIDE 0.9 % IV SOLN
INTRAVENOUS | Status: DC | PRN
  Administered 2017-12-07: 50 ug/min via INTRAVENOUS

## 2017-12-07 MED ORDER — ONDANSETRON HCL 4 MG PO TABS: 4.0000 mg | ORAL_TABLET | Freq: Four times a day (QID) | ORAL | Status: DC | PRN

## 2017-12-07 MED ORDER — POLYETHYLENE GLYCOL 3350 17 G PO PACK: 17.0000 g | PACK | Freq: Every day | ORAL | Status: DC | PRN

## 2017-12-07 MED ORDER — ONDANSETRON HCL 4 MG/2ML IJ SOLN: 4.0000 mg | Freq: Once | INTRAMUSCULAR | Status: DC | PRN

## 2017-12-07 MED ORDER — METOCLOPRAMIDE HCL 5 MG/ML IJ SOLN: 5.0000 mg | Freq: Three times a day (TID) | INTRAMUSCULAR | Status: DC | PRN

## 2017-12-07 MED ORDER — HYDROMORPHONE HCL 1 MG/ML IJ SOLN
0.2500 mg | INTRAMUSCULAR | Status: DC | PRN
  Administered 2017-12-07 (×4): 0.5 mg via INTRAVENOUS

## 2017-12-07 MED ORDER — LIDOCAINE 2% (20 MG/ML) 5 ML SYRINGE
INTRAMUSCULAR | Status: AC
  Filled 2017-12-07: qty 5

## 2017-12-07 MED ORDER — METOCLOPRAMIDE HCL 5 MG PO TABS: 5.0000 mg | ORAL_TABLET | Freq: Three times a day (TID) | ORAL | Status: DC | PRN

## 2017-12-07 MED ORDER — ACETAMINOPHEN 160 MG/5ML PO SOLN: 325.0000 mg | ORAL | Status: DC | PRN

## 2017-12-07 MED ORDER — TRAMADOL HCL 50 MG PO TABS
50.0000 mg | ORAL_TABLET | Freq: Four times a day (QID) | ORAL | Status: DC | PRN
  Administered 2017-12-07 – 2017-12-10 (×5): 100 mg via ORAL
  Filled 2017-12-07 (×5): qty 2

## 2017-12-07 MED ORDER — DOCUSATE SODIUM 100 MG PO CAPS
100.0000 mg | ORAL_CAPSULE | Freq: Two times a day (BID) | ORAL | Status: DC
  Administered 2017-12-07 – 2017-12-10 (×6): 100 mg via ORAL
  Filled 2017-12-07 (×6): qty 1

## 2017-12-07 MED ORDER — ONDANSETRON HCL 4 MG/2ML IJ SOLN
INTRAMUSCULAR | Status: AC
  Filled 2017-12-07: qty 2

## 2017-12-07 MED ORDER — ACETAMINOPHEN 325 MG PO TABS: 325.0000 mg | ORAL_TABLET | ORAL | Status: DC | PRN

## 2017-12-07 MED ORDER — DIPHENHYDRAMINE HCL 12.5 MG/5ML PO ELIX: 12.5000 mg | ORAL_SOLUTION | ORAL | Status: DC | PRN

## 2017-12-07 MED ORDER — MIDAZOLAM HCL 2 MG/2ML IJ SOLN
INTRAMUSCULAR | Status: AC
  Filled 2017-12-07: qty 2

## 2017-12-07 MED ORDER — CEFAZOLIN SODIUM-DEXTROSE 1-4 GM/50ML-% IV SOLN
1.0000 g | Freq: Four times a day (QID) | INTRAVENOUS | Status: AC
  Administered 2017-12-07 (×2): 1 g via INTRAVENOUS
  Filled 2017-12-07 (×2): qty 50

## 2017-12-07 MED ORDER — MENTHOL 3 MG MT LOZG: 1.0000 | LOZENGE | OROMUCOSAL | Status: DC | PRN

## 2017-12-07 MED ORDER — MIDAZOLAM HCL 5 MG/5ML IJ SOLN
INTRAMUSCULAR | Status: DC | PRN
  Administered 2017-12-07: 2 mg via INTRAVENOUS

## 2017-12-07 MED ORDER — 0.9 % SODIUM CHLORIDE (POUR BTL) OPTIME
TOPICAL | Status: DC | PRN
  Administered 2017-12-07: 1000 mL

## 2017-12-07 MED ORDER — FENTANYL CITRATE (PF) 100 MCG/2ML IJ SOLN: 25.0000 ug | INTRAMUSCULAR | Status: DC | PRN

## 2017-12-07 MED ORDER — METHOCARBAMOL 500 MG IVPB - SIMPLE MED
INTRAVENOUS | Status: AC
  Administered 2017-12-07: 500 mg
  Filled 2017-12-07: qty 50

## 2017-12-07 SURGICAL SUPPLY — 46 items
BAG SPEC THK2 15X12 ZIP CLS (MISCELLANEOUS) ×1
BAG ZIPLOCK 12X15 (MISCELLANEOUS) ×3 IMPLANT
CABLE CERLAGE W/CRIMP 1.8 (Cable) ×2 IMPLANT
CABLE CERLAGE W/CRIMP 1.8MM (Cable) ×2 IMPLANT
CLOSURE WOUND 1/2 X4 (GAUZE/BANDAGES/DRESSINGS) ×2
COVER SURGICAL LIGHT HANDLE (MISCELLANEOUS) ×3 IMPLANT
COVER WAND RF STERILE (DRAPES) ×2 IMPLANT
DRAPE INCISE IOBAN 66X45 STRL (DRAPES) ×3 IMPLANT
DRAPE ORTHO SPLIT 77X108 STRL (DRAPES) ×3
DRAPE POUCH INSTRU U-SHP 10X18 (DRAPES) ×3 IMPLANT
DRAPE SURG 17X11 SM STRL (DRAPES) ×3 IMPLANT
DRAPE SURG ORHT 6 SPLT 77X108 (DRAPES) ×2 IMPLANT
DRAPE U-SHAPE 47X51 STRL (DRAPES) ×3 IMPLANT
DRSG MEPILEX BORDER 4X8 (GAUZE/BANDAGES/DRESSINGS) ×2 IMPLANT
DURAPREP 26ML APPLICATOR (WOUND CARE) ×3 IMPLANT
ELECT REM PT RETURN 15FT ADLT (MISCELLANEOUS) ×3 IMPLANT
EVACUATOR 1/8 PVC DRAIN (DRAIN) IMPLANT
FACESHIELD WRAPAROUND (MASK) ×9 IMPLANT
FACESHIELD WRAPAROUND OR TEAM (MASK) ×4 IMPLANT
GLOVE BIOGEL PI IND STRL 7.0 (GLOVE) ×1 IMPLANT
GLOVE BIOGEL PI IND STRL 8 (GLOVE) ×1 IMPLANT
GLOVE BIOGEL PI INDICATOR 7.0 (GLOVE) ×2
GLOVE BIOGEL PI INDICATOR 8 (GLOVE) ×2
GLOVE SURG SS PI 7.0 STRL IVOR (GLOVE) ×5 IMPLANT
GLOVE SURG SS PI 7.5 STRL IVOR (GLOVE) ×2 IMPLANT
GLOVE SURG SS PI 8.0 STRL IVOR (GLOVE) ×2 IMPLANT
GOWN STRL REUS W/TWL LRG LVL3 (GOWN DISPOSABLE) ×6 IMPLANT
GOWN STRL REUS W/TWL XL LVL3 (GOWN DISPOSABLE) ×3 IMPLANT
KIT BASIN OR (CUSTOM PROCEDURE TRAY) ×3 IMPLANT
MANIFOLD NEPTUNE II (INSTRUMENTS) ×3 IMPLANT
PACK TOTAL JOINT (CUSTOM PROCEDURE TRAY) ×3 IMPLANT
PADDING CAST COTTON 6X4 STRL (CAST SUPPLIES) ×6 IMPLANT
PROTECTOR NERVE ULNAR (MISCELLANEOUS) ×3 IMPLANT
SPONGE LAP 18X18 RF (DISPOSABLE) ×3 IMPLANT
STAPLER VISISTAT 35W (STAPLE) IMPLANT
STRIP CLOSURE SKIN 1/2X4 (GAUZE/BANDAGES/DRESSINGS) ×2 IMPLANT
SUT MNCRL AB 4-0 PS2 18 (SUTURE) ×2 IMPLANT
SUT STRATAFIX 0 PDS 27 VIOLET (SUTURE) ×3
SUT VIC AB 1 CT1 27 (SUTURE) ×3
SUT VIC AB 1 CT1 27XBRD ANTBC (SUTURE) ×3 IMPLANT
SUT VIC AB 2-0 CT1 27 (SUTURE) ×9
SUT VIC AB 2-0 CT1 TAPERPNT 27 (SUTURE) ×3 IMPLANT
SUTURE STRATFX 0 PDS 27 VIOLET (SUTURE) IMPLANT
TOWEL OR 17X26 10 PK STRL BLUE (TOWEL DISPOSABLE) ×6 IMPLANT
TRAY FOL W/BAG SLVR 16FR STRL (SET/KITS/TRAYS/PACK) IMPLANT
TRAY FOLEY W/BAG SLVR 16FR LF (SET/KITS/TRAYS/PACK) ×3

## 2017-12-07 NOTE — Anesthesia Procedure Notes (Signed)
Date/Time: 12/07/2017 9:30 AM Performed by: Glory Buff, CRNA Oxygen Delivery Method: Simple face mask

## 2017-12-07 NOTE — Progress Notes (Signed)
Initial Nutrition Assessment  INTERVENTION:   Diet advancement per MD Once diet advanced, provide Ensure Enlive po BID, each supplement provides 350 kcal and 20 grams of protein  NUTRITION DIAGNOSIS:   Increased nutrient needs related to post-op healing as evidenced by estimated needs.  GOAL:   Patient will meet greater than or equal to 90% of their needs  MONITOR:   PO intake, Supplement acceptance, Labs, Weight trends, I & O's, Diet advancement  REASON FOR ASSESSMENT:   Consult Hip fracture protocol  ASSESSMENT:   77 y.o. female with medical history significant for hyperlipidemia, glaucoma, heart murmur , osteoarthritis of left hip status post total hip arthroplasty (10/2017), osteoporosis who presents on 12/06/2017 with worsening left hip pain x1 day after a fall  Patient currently in OR for ORIF. Per chart review, pt fell 11/27 after tripping on a chair. Unsure of nutrition history. Per chart review, pt did not eat the day she fell. Was on a diet yesterday until surgery was planned for today.   Per weight records, pt has lost 4 lb since 9/26 (4% wt loss x 2 months, insignificant for time frame).  Labs reviewed. Medications: Lactated ringers infusion  NUTRITION - FOCUSED PHYSICAL EXAM:  Pt in OR.  Diet Order:   Diet Order            Diet NPO time specified  Diet effective now              EDUCATION NEEDS:   Not appropriate for education at this time  Skin:  Skin Assessment: Reviewed RN Assessment  Last BM:  11/28  Height:   Ht Readings from Last 1 Encounters:  12/06/17 5' (1.524 m)    Weight:   Wt Readings from Last 1 Encounters:  12/06/17 44.5 kg    Ideal Body Weight:  45.5 kg  BMI:  Body mass index is 19.14 kg/m.  Estimated Nutritional Needs:   Kcal:  1250-1450  Protein:  55-65g  Fluid:  1.5L/day  Clayton Bibles, MS, RD, LDN [place] Dietitian Pager: [phone] After Hours Pager: [phone]

## 2017-12-07 NOTE — Transfer of Care (Signed)
Immediate Anesthesia Transfer of Care Note  Patient: [name] [name]  Procedure(s) Performed: OPEN REDUCTION INTERNAL FIXATION (ORIF) DISTAL FEMUR FRACTURE (Left )  Patient Location: PACU  Anesthesia Type:MAC and Spinal  Level of Consciousness: drowsy, patient cooperative and responds to stimulation  Airway & Oxygen Therapy: Patient Spontanous Breathing and Patient connected to face mask oxygen  Post-op Assessment: Report given to RN and Post -op Vital signs reviewed and stable  Post vital signs: Reviewed and stable  Last Vitals:  Vitals Value Taken Time  BP    Temp    Pulse 57 12/07/2017 10:56 AM  Resp 14 12/07/2017 10:56 AM  SpO2 100 % 12/07/2017 10:56 AM  Vitals shown include unvalidated device data.  Last Pain:  Vitals:   12/07/17 0654  TempSrc:   PainSc: 2          Complications: No apparent anesthesia complications

## 2017-12-07 NOTE — Op Note (Signed)
NAME: [name] [name], [name] [name] MEDICAL RECORD [id number] ACCOUNT [id number] DATE OF BIRTH:1940/09/18 FACILITY: [facility] LOCATION: [facility] PHYSICIAN:Dayan Desa Zella Ball, MD  OPERATIVE REPORT  DATE OF PROCEDURE:  12/07/2017  PREOPERATIVE DIAGNOSIS:  Left periprosthetic femur fracture.  POSTOPERATIVE DIAGNOSIS:  Left periprosthetic femur fracture.  PROCEDURE:  Open reduction internal fixation of left periprosthetic femur fracture.  SURGEON:  [name], MD  ASSISTANT:  Theresa Duty, PA-C  ANESTHESIA:  Spinal.  ESTIMATED BLOOD LOSS:  25 mL  DRAINS:  None.  COMPLICATIONS:  None.  CONDITION:  Stable to recovery.  BRIEF CLINICAL NOTE:  The patient is a 77 year old female who had a left total hip arthroplasty done approximately 2 months ago.  She was walking and tripped over a chair at home 2 nights ago and fell with immediate pain in her left thigh.  She presented  to the ER yesterday, noted to have a periprosthetic femur fracture.  She presents today for open reduction internal fixation.  PROCEDURE IN DETAIL:  After successful administration of spinal anesthetic, the patient was placed in the right lateral decubitus position with the left side up and held with a hip positioner.  Left lower extremity was isolated from the perineum with  plastic drapes and prepped and draped in the usual sterile fashion.  Lateral incision was made over the proximal thigh.  Skin cut with a #10 blade through subcutaneous tissue to the level of the fascia lata, which was incised in line with the skin  incision.  The sciatic nerve was palpated and protected.  I incised the fascia of the vastus lateralis and elevated the muscle off the posterior intermuscular septum.  The fracture is identified and with a tiny bit of traction and rotation it was  reduced.  I then passed 2 Zimmer 1.8 mm cables around the femur and sequentially tightened the cables with the cable gripper until the fracture was anatomically reduced.   The cables were then crimped and cut and the fracture reduction is well maintained  through range of motion.  The wound was then copiously irrigated with saline solution and the fascia of the vastus lateralis was closed with a running #1 Vicryl suture.  The fascia lata was then closed with a running 0 Stratafix suture.  Subcutaneous was  closed with interrupted 2-0 Vicryl and subcuticular running 4-0 Monocryl.  The incision was cleaned and dried and Steri-Strips and a bulky sterile dressing were applied.  She was then awakened and transported to recovery in stable condition.  TN/NUANCE  D:12/07/2017 T:12/07/2017 JOB:[id number]

## 2017-12-07 NOTE — Anesthesia Postprocedure Evaluation (Signed)
Anesthesia Post Note  Patient: Anira Senegal Dehart  Procedure(s) Performed: OPEN REDUCTION INTERNAL FIXATION (ORIF) DISTAL FEMUR FRACTURE (Left )     Patient location during evaluation: PACU Anesthesia Type: Spinal Level of consciousness: oriented and awake and alert Pain management: pain level controlled Vital Signs Assessment: post-procedure vital signs reviewed and stable Respiratory status: spontaneous breathing, respiratory function stable and patient connected to nasal cannula oxygen Cardiovascular status: blood pressure returned to baseline and stable Postop Assessment: no headache, no backache and no apparent nausea or vomiting Anesthetic complications: no    Last Vitals:  Vitals:   12/07/17 1215 12/07/17 1237  BP: 126/68 (!) 144/79  Pulse: 76 70  Resp: 17   Temp:  36.6 C  SpO2: 100% 100%    Last Pain:  Vitals:   12/07/17 1237  TempSrc: Oral  PainSc:                  Magie Ciampa

## 2017-12-07 NOTE — Anesthesia Preprocedure Evaluation (Addendum)
Anesthesia Evaluation  Patient identified by MRN, date of birth, ID band Patient awake    Reviewed: Allergy & Precautions, NPO status , Patient's Chart, lab work & pertinent test results  History of Anesthesia Complications (+) DIFFICULT AIRWAY and history of anesthetic complications  Airway Mallampati: II  TM Distance: <3 FB Neck ROM: Full    Dental  (+) Teeth Intact, Dental Advisory Given, Caps,    Pulmonary former smoker,  H/o tuberculosis   Pulmonary exam normal breath sounds clear to auscultation       Cardiovascular Exercise Tolerance: Good negative cardio ROS   Rhythm:Regular Rate:Normal + Systolic murmurs    Neuro/Psych negative neurological ROS  negative psych ROS   GI/Hepatic GERD  ,Gilbert's syndrome- coags normal   Endo/Other  negative endocrine ROS  Renal/GU negative Renal ROS     Musculoskeletal  (+) Arthritis ,   Abdominal   Peds  Hematology negative hematology ROS (+) Plt 237k   Anesthesia Other Findings Day of surgery medications reviewed with the patient.  Reproductive/Obstetrics                             Anesthesia Physical  Anesthesia Plan  ASA: II  Anesthesia Plan: Spinal   Post-op Pain Management:    Induction:   PONV Risk Score and Plan: 2 and Propofol infusion, Treatment may vary due to age or medical condition and Ondansetron  Airway Management Planned: Natural Airway and Simple Face Mask  Additional Equipment:   Intra-op Plan:   Post-operative Plan:   Informed Consent: I have reviewed the patients History and Physical, chart, labs and discussed the procedure including the risks, benefits and alternatives for the proposed anesthesia with the patient or authorized representative who has indicated his/her understanding and acceptance.   Dental advisory given  Plan Discussed with: CRNA, Anesthesiologist and Surgeon  Anesthesia Plan Comments:          Anesthesia Quick Evaluation

## 2017-12-07 NOTE — Evaluation (Signed)
Physical Therapy Evaluation Patient Details Name: [name] [name] MRN: [id number] DOB: Jan 28, 1940 Today's Date: 12/07/2017   History of Present Illness  77 YO female s/p L DA-THA on 10/17/17, admitted 11/28 for fall resulting in L periprosthetic fracture. Pt is s/p ORIF of L periprosthetic femur fracture on 12/07/17. PMH includes glaucoma, edema, OA, IBS, TB, osteopenia, cataracts.   Clinical Impression   Pt presents with L hip pain, increased time and effort to perform mobility tasks, difficulty ambulating due to pain, and decreased activity tolerance. Pt to benefit from acute PT to address deficits. Pt ambulated 40 ft with RW with min guard assist, required verbal cuing throughout. Pt educated on quad sets (5-10/hour), ankle pumps (20/hour), and heel slides (5-10/hour) to perform this afternoon/evening to lessen stiffness and increase circulation, to pt's tolerance and limited by pain. Pt verbalized to PT that she was disappointed and upset with last hospitalization. PT to progress mobility as tolerated, and will continue to follow acutely.      Follow Up Recommendations Follow surgeon's recommendation for DC plan and follow-up therapies;Supervision for mobility/OOB(HHPT )    Equipment Recommendations  None recommended by PT    Recommendations for Other Services       Precautions / Restrictions Precautions Precautions: Fall Restrictions Weight Bearing Restrictions: No RLE Weight Bearing: Weight bearing as tolerated Other Position/Activity Restrictions: WBAT       Mobility  Bed Mobility Overal bed mobility: Needs Assistance Bed Mobility: Supine to Sit     Supine to sit: Min assist;HOB elevated     General bed mobility comments: Min assist for LE management, although pt very insistent on not receiving any assist from PT. Pt required stabilization at EOB due to sliding forward on mattress after refusing assist.   Transfers Overall transfer level: Needs assistance Equipment  used: Rolling walker (2 wheeled) Transfers: Sit to/from Stand Sit to Stand: Min guard;From elevated surface         General transfer comment: Min guard for safety. VC for hand placement.   Ambulation/Gait Ambulation/Gait assistance: Min guard Gait Distance (Feet): 40 Feet Assistive device: Rolling walker (2 wheeled) Gait Pattern/deviations: Step-to pattern;Decreased stride length;Antalgic;Trunk flexed Gait velocity: decr    General Gait Details: Min guard for safety. Pt with very antalgic gait, decreased pain with smaller steps. Verbal cuing for sequencing, placement in RW.   Stairs            Wheelchair Mobility    Modified Rankin (Stroke Patients Only)       Balance Overall balance assessment: Mild deficits observed, not formally tested                                           Pertinent Vitals/Pain Pain Assessment: 0-10 Pain Score: 5  Pain Location: L hip, knee Pain Descriptors / Indicators: Sore Pain Intervention(s): Limited activity within patient's tolerance;Repositioned;Ice applied;Monitored during session    [place] expects to be discharged to:: Private residence Living Arrangements: Spouse/significant other Available Help at Discharge: Family;Available PRN/intermittently Type of Home: House Home Access: Stairs to enter Entrance Stairs-Rails: Left Entrance Stairs-Number of Steps: 5 Home Layout: Two level;1/2 bath on main level Home Equipment: Walker - 2 wheels;Cane - single point      Prior Function Level of Independence: Independent         Comments: walking without AD 5-6 days after THA     Hand  Dominance   Dominant Hand: Right    Extremity/Trunk Assessment   Upper Extremity Assessment Upper Extremity Assessment: Overall WFL for tasks assessed    Lower Extremity Assessment Lower Extremity Assessment: Overall WFL for tasks assessed;LLE deficits/detail LLE Deficits / Details: Suspected  post-surgical hip weakness; able to perform quad set, ankle pumps. Difficulty with SLR  LLE: Unable to fully assess due to pain LLE Sensation: WNL    Cervical / Trunk Assessment Cervical / Trunk Assessment: Normal  Communication   Communication: No difficulties  Cognition Arousal/Alertness: Awake/alert Behavior During Therapy: WFL for tasks assessed/performed Overall Cognitive Status: Within Functional Limits for tasks assessed                                        General Comments      Exercises Total Joint Exercises Ankle Circles/Pumps: AROM;Both;5 reps;Seated   Assessment/Plan    PT Assessment Patient needs continued PT services  PT Problem List Decreased strength;Pain;Decreased activity tolerance;Decreased knowledge of use of DME;Decreased balance;Decreased mobility;Decreased safety awareness       PT Treatment Interventions Therapeutic activities;DME instruction;Gait training;Therapeutic exercise;Patient/family education;Stair training;Balance training;Functional mobility training    PT Goals (Current goals can be found in the Care Plan section)  Acute Rehab PT Goals PT Goal Formulation: With patient Time For Goal Achievement: 12/14/17 Potential to Achieve Goals: Good    Frequency Min 6X/week   Barriers to discharge        Co-evaluation               AM-PAC PT "6 Clicks" Mobility  Outcome Measure Help needed turning from your back to your side while in a flat bed without using bedrails?: A Little Help needed moving from lying on your back to sitting on the side of a flat bed without using bedrails?: A Little Help needed moving to and from a bed to a chair (including a wheelchair)?: A Little Help needed standing up from a chair using your arms (e.g., wheelchair or bedside chair)?: A Little Help needed to walk in hospital room?: A Little Help needed climbing 3-5 steps with a railing? : A Little 6 Click Score: 18    End of Session  Equipment Utilized During Treatment: Gait belt Activity Tolerance: Patient tolerated treatment well Patient left: in chair;with chair alarm set;with call bell/phone within reach;with family/visitor present Nurse Communication: Mobility status PT Visit Diagnosis: Other abnormalities of gait and mobility (R26.89);Difficulty in walking, not elsewhere classified (R26.2)    Time: 1427-1500 PT Time Calculation (min) (ACUTE ONLY): 33 min   Charges:   PT Evaluation $PT Eval Low Complexity: 1 Low PT Treatments $Gait Training: 8-22 mins        Julien Girt, PT Acute Rehabilitation Services Pager [phone]  Office [phone]   Challis Crill D Elonda Husky 12/07/2017, 5:18 PM

## 2017-12-07 NOTE — Discharge Instructions (Addendum)
Dr. [name] Total Joint Specialist [facility] [address], [place], [place] [address] [phone]  [place], Guidelines Following Surgery   HOME CARE INSTRUCTIONS   Remove items at home which could result in a fall. This includes throw rugs or furniture in walking pathways.   ICE to the affected hip every three hours for 30 minutes at a time and then as needed for pain and swelling.  Continue to use ice on the hip for pain and swelling from surgery. You may notice swelling that will progress down to the foot and ankle.  This is normal after surgery.  Elevate the leg when you are not up walking on it.    Continue to use the breathing machine which will help keep your temperature down.  It is common for your temperature to cycle up and down following surgery, especially at night when you are not up moving around and exerting yourself.  The breathing machine keeps your lungs expanded and your temperature down.  DRESSING / WOUND CARE / SHOWERING You may shower 3 days after surgery, but keep the wounds dry during showering.  You may use an occlusive plastic wrap (Press'n Seal for example), NO SOAKING/SUBMERGING IN THE BATHTUB.  If the bandage gets wet, change with a clean dry gauze.  If the incision gets wet, pat the wound dry with a clean towel. You may start showering once you are discharged home but do not submerge the incision under water. Just pat the incision dry and apply a dry gauze dressing on daily. Change the surgical dressing daily and reapply a dry dressing each time.  ACTIVITY Walk with your walker as instructed. Use walker as long as suggested by your caregivers. Avoid periods of inactivity such as sitting longer than an hour when not asleep. This helps prevent blood clots.  Do not drive a car for 6 weeks or until released by you surgeon.  Do not drive while taking narcotics.  WEIGHT  BEARING Weight bearing as tolerated with assist device (walker, cane, etc) as directed, use it as long as suggested by your surgeon or therapist, typically at least 4-6 weeks.  MEDICATIONS See your medication summary on the After Visit Summary that the nursing staff will review with you prior to discharge.  You may have some home medications which will be placed on hold until you complete the course of blood thinner medication.  It is important for you to complete the blood thinner medication as prescribed by your surgeon.  Continue your approved medications as instructed at time of discharge.  Information on my medicine - XARELTO (Rivaroxaban)  Why was Xarelto prescribed for you? Xarelto was prescribed for you to reduce the risk of blood clots forming after orthopedic surgery. The medical term for these abnormal blood clots is venous thromboembolism (VTE).  What do you need to know about xarelto ? Take your Xarelto ONCE DAILY at the same time every day. You may take it either with or without food.  If you have difficulty swallowing the tablet whole, you may crush it and mix in applesauce just prior to taking your dose.  Take Xarelto exactly as prescribed by your doctor and DO NOT stop taking Xarelto without talking to the doctor who prescribed the medication.  Stopping without other VTE prevention medication to take the place of Xarelto may increase your risk of developing a clot.  After discharge, you should have regular check-up appointments with  your healthcare provider that is prescribing your Xarelto.    What do you do if you miss a dose? If you miss a dose, take it as soon as you remember on the same day then continue your regularly scheduled once daily regimen the next day. Do not take two doses of Xarelto on the same day.   Important Safety Information A possible side effect of Xarelto is bleeding. You should call your healthcare provider right away if you experience  any of the following: ? Bleeding from an injury or your nose that does not stop. ? Unusual colored urine (red or dark brown) or unusual colored stools (red or black). ? Unusual bruising for unknown reasons. ? A serious fall or if you hit your head (even if there is no bleeding).  Some medicines may interact with Xarelto and might increase your risk of bleeding while on Xarelto. To help avoid this, consult your healthcare provider or pharmacist prior to using any new prescription or non-prescription medications, including herbals, vitamins, non-steroidal anti-inflammatory drugs (NSAIDs) and supplements.  This website has more information on Xarelto: [email or web address]                                                    FOLLOW-UP APPOINTMENTS Make sure you keep all of your appointments after your operation with your surgeon and caregivers. You should call the office at the above phone number and make an appointment for approximately two weeks after the date of your surgery or on the date instructed by your surgeon outlined in the "After Visit Summary".  IF YOU ARE TRANSFERRED TO A SKILLED REHAB FACILITY If the patient is transferred to a skilled rehab facility following release from the hospital, a list of the current medications will be sent to the facility for the patient to continue.  When discharged from the skilled rehab facility, please have the facility set up the patient's [place] prior to being released. Also, the skilled facility will be responsible for providing the patient with their medications at time of release from the facility to include their pain medication, the muscle relaxants, and their blood thinner medication. If the patient is still at the rehab facility at time of the two week follow up appointment, the skilled rehab facility will also need to assist the patient in arranging follow up appointment in our office and any transportation needs.  MAKE SURE  YOU:   Understand these instructions.   Get help right away if you are not doing well or get worse.   Do not submerge incision under water. Please use good hand washing techniques while changing dressing each day. May shower starting three days after surgery. Please use a clean towel to pat the incision dry following showers. Continue to use ice for pain and swelling after surgery. Do not use any lotions or creams on the incision until instructed by your surgeon.

## 2017-12-07 NOTE — Progress Notes (Addendum)
OT Cancellation Note  Patient Details Name: ALAINNA STAWICKI MRN: [id number] DOB: [birth year]   Cancelled Treatment:    Reason Eval/Treat Not Completed: Medical issues which prohibited therapy.  Noted plan is for ORIF.  Please reorder after sx if OT is needed. Thanks.  Ziasia Lenoir 12/07/2017, 8:27 AM  Lesle Chris, OTR/L Acute Rehabilitation Services [phone] [facility] pager [phone] office 12/07/2017

## 2017-12-07 NOTE — Progress Notes (Signed)
PROGRESS NOTE    [name] [name]  HUT:[id number] DOB: Jan 17, 1940 DOA: 12/06/2017 PCP: Velna Hatchet, MD  Brief Narrative:  HPI per Dr. Oretha Milch on 12/06/17  [name] [name] is a 77 y.o. 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  She landed on her left hip without hitting her head or losing consciousness. Her husband helped her up and got her upstairs. The pain in her leg started later during the night whenever she moved. She tried to get up this morning but couldn't bear weight to go to the bathroom. She called her orthopedics doctor on call who suggested she come [facility] ED  She had been in her usual state of health prior to that. Denies any no fevers, chills, no chest pain. No dyspnea. No lightheadedness or dizziness.   She has been doing well since her recent left hip replacement. She has returned to the gym doing resistance work (about 20 lbs with her personal trainer since the procedure) and light training with no complications.   She was on her Xarelto for about 2 weeks after discharge for DVT prophylaxis.  She followed up by baby aspirin for the remaining week.  She reports intolerance to higher dose of aspirin with severe abdominal pain.  She has been off both agents since 11/21  History of Osteoporosis. She had been on fosamax for about 5 years. Evista was added in 2010-2011. She hasn't been on anything other than vitamin D and calcium. Her mother and sister both have osteopenia. No history of chronic prednisone therapy.   **Underwent ORIF today and is complaining of post-operative pain,  Assessment & Plan:   Active Problems:   Vitamin D deficiency   Hyperlipidemia   IBS   OA (osteoarthritis) of hip   S/p  left hip fracture   Sinus tachycardia  Left proximal femur fracture related to mechanical fall, status post recent left hip total arthroplasty -Fracture close to femoral component of recent ipsilateral total hip replacement -Stemmed from mechanical fall -No concern for syncope -Ortho consulted and took the patient for ORIF -C/w IV antiemetics with p.o./IV 4 mg Zofran every 6 hours as needed and with metoclopramide 5 to 10 mg p.o./IV every 8 PRN for nausea if Zofran is ineffective, pain control with hydrocodone-acetaminophen 1-2 tabs p.o. every 6 PRN for moderate pain along with tramadol 50 to 100 mg p.o. every 6 PRN for moderate pain -Continue with IV morphine 0.5 mg IV every 2 hours PRN for severe pain -Bowel regimen with senna docusate, MiraLAX and just regular docusate -Subcutaneous Lovenox prophylaxis (previously completed Xarelto followed by baby aspirin as an outpatient for recent total hip arthroplasty) now been changed to rivaroxaban 10 mg p.o. daily with breakfast   Macrocytic Anemia -Patient's MCV was 97.3 yesterday but today is 102.1 -Hemoglobin/hematocrit went from 13.4/43.0 and is now 11.7/38.9 -Check Anemia Panel in AM -Expect postoperative drop -Continue to monitor for signs and symptoms of bleeding she is anticoagulated again with rivaroxaban 10 mg p.o. daily -Repeat CBC in a.m.  Elevated BP, improved -SBP in the 140s to 150s on admission is now 132/68 this afternoon -Likely elevated in setting of pain -No formal diagnosis of Hypertension -Spironolactone 25 mg p.o. nightly -Closely monitor and C/w  IV hydralazine 5  mg IV q6hprn SBP greater than 180, DBP greater than 100  Sinus Tachycardia, improved -Confirmed on EKG and is now improved -Suspect related to pain and dehydration from diminished p.o. intake -Normal saline 75 cc an hour x24 hours, reassess -C/w Telemetry Monitoring   Hyperlipidemia -Has been off statin since June bc of improvement in LDL per her  report -Follow up with PCP as an outpatient   Osteoarthritis left hip, s/p Total hip arthroplasty on 10/17/2017, by Dr. [name] -Previous DVT prophylaxis, Xarelto completed for now will be read is initiated -Lovenox prophylaxis here but held for Surgery -Will Defer to Orthopedic Surgery   Dry eyes -Eyedrops nonformulary, family will bring home med  Glaucoma, stable -Continue Travatan and Dorzolamide  History of IBS -No current abdominal pain -No home meds  Reports history of Osteoporosis -History of Fosamax and raloxifene in the past -Given left hip fracture after ground-level fall, consistent with osteoporosis  DVT prophylaxis: Per Orthopedic Surgery  Code Status: FULL CODE Family Communication: Husband at bedside  Disposition Plan: Pending PT/OT Evaluation  Consultants:   Orthopedic Surgery Dr. Pilar Plate Aluisio   Procedures:  Open reduction internal fixation of left periprosthetic femur fracture done by Dr. Wynelle Link on 12/07/17   Antimicrobials:  Anti-infectives (From admission, onward)   Start     Dose/Rate Route Frequency Ordered Stop   12/07/17 1600  ceFAZolin (ANCEF) IVPB 1 g/50 mL premix     1 g 100 mL/hr over 30 Minutes Intravenous Every 6 hours 12/07/17 1247 12/08/17 0359   12/07/17 1100  ceFAZolin (ANCEF) IVPB 2g/100 mL premix     2 g 200 mL/hr over 30 Minutes Intravenous  Once 12/07/17 0709 12/07/17 1009     Subjective: Seen and examined at bedside after surgery and she was seen in the chair at bedside and she is complaining of left hip pain.  No nausea or vomiting.  Denies any chest pain, lightheadedness or dizziness.  No other concerns or complaints at this time except for pain which is significant and states it is not amenable to the medication she has been receiving.  Objective: Vitals:   12/07/17 1215 12/07/17 1237 12/07/17 1344 12/07/17 1516  BP: 126/68 (!) 144/79 (!) 130/53 132/68  Pulse: 76 70 61 77  Resp: 17  16 17   Temp:  97.9 F  (36.6 C) 97.9 F (36.6 C) 97.6 F (36.4 C)  TempSrc:  Oral Oral Oral  SpO2: 100% 100% 99% 98%  Weight:      Height:        Intake/Output Summary (Last 24 hours) at 12/07/2017 1545 Last data filed at 12/07/2017 1200 Gross per 24 hour  Intake 2725.47 ml  Output 475 ml  Net 2250.47 ml   Filed Weights   12/06/17 0840  Weight: 44.5 kg   Examination: Physical Exam:  Constitutional: Thin female in NAD and appears calm but uncomfortable Eyes: Lids and conjunctivae normal, sclerae anicteric  ENMT: External Ears, Nose appear normal. Grossly normal hearing.  Neck: Appears normal, supple, no cervical masses, normal ROM, no appreciable thyromegaly; no JVD Respiratory: Diminished to auscultation bilaterally, no wheezing, rales, rhonchi or crackles. Normal respiratory effort and patient is not tachypenic. No accessory muscle use.  Cardiovascular: RRR, no murmurs / rubs / gallops. S1 and S2 auscultated.  Abdomen: Soft, non-tender, non-distended. No masses palpated. No appreciable hepatosplenomegaly. Bowel sounds positive x4.  GU: Deferred. Musculoskeletal: No clubbing / cyanosis of digits/nails.  Skin: No rashes, lesions, ulcers on a limited skin eval. No induration;  Warm and dry.  Neurologic: CN 2-12 grossly intact with no focal deficits. Romberg sign and cerebellar reflexes not assessed.  Psychiatric: Normal judgment and insight. Alert and oriented x 3. Normal mood and appropriate affect.   Data Reviewed: I have personally reviewed following labs and imaging studies  CBC: Recent Labs  Lab 12/06/17 0950 12/07/17 0635  WBC 9.0 6.5  NEUTROABS 7.7  --   HGB 13.4 11.7*  HCT 43.0 38.9  MCV 97.3 102.1*  PLT 249 921   Basic Metabolic Panel: Recent Labs  Lab 12/06/17 0950 12/07/17 0635  NA 141 141  K 4.5 4.2  CL 112* 111  CO2 22 26  GLUCOSE 123* 104*  BUN 17 13  CREATININE 0.71 0.74  CALCIUM 9.5  9.4 8.7*   GFR: Estimated Creatinine Clearance: 42 mL/min (by C-G formula  based on SCr of 0.74 mg/dL). Liver Function Tests: Recent Labs  Lab 12/06/17 0950  ALBUMIN 4.5   No results for input(s): LIPASE, AMYLASE in the last 168 hours. No results for input(s): AMMONIA in the last 168 hours. Coagulation Profile: No results for input(s): INR, PROTIME in the last 168 hours. Cardiac Enzymes: No results for input(s): CKTOTAL, CKMB, CKMBINDEX, TROPONINI in the last 168 hours. BNP (last 3 results) No results for input(s): PROBNP in the last 8760 hours. HbA1C: No results for input(s): HGBA1C in the last 72 hours. CBG: No results for input(s): GLUCAP in the last 168 hours. Lipid Profile: No results for input(s): CHOL, HDL, LDLCALC, TRIG, CHOLHDL, LDLDIRECT in the last 72 hours. Thyroid Function Tests: No results for input(s): TSH, T4TOTAL, FREET4, T3FREE, THYROIDAB in the last 72 hours. Anemia Panel: No results for input(s): VITAMINB12, FOLATE, FERRITIN, TIBC, IRON, RETICCTPCT in the last 72 hours. Sepsis Labs: No results for input(s): PROCALCITON, LATICACIDVEN in the last 168 hours.  No results found for this or any previous visit (from the past 240 hour(s)).   Radiology Studies: Dg Pelvis Portable  Result Date: 12/07/2017 CLINICAL DATA:  Hip replacement. EXAM: PORTABLE PELVIS 1-2 VIEWS COMPARISON:  10/17/2017. FINDINGS: Total left hip replacement. Hardware intact. No acute bony abnormality identified. Degenerative change lumbar spine and right hip. IMPRESSION: Total left hip replacement. Hardware intact. No acute abnormality identified. Electronically Signed   By: Marcello Moores  Register   On: 12/07/2017 11:19   Dg Hip Unilat W Or Wo Pelvis 1 View Left  Result Date: 12/06/2017 CLINICAL DATA:  Pain after fall EXAM: DG HIP (WITH OR WITHOUT PELVIS) 1V*L* COMPARISON:  None. FINDINGS: There is a fracture surrounding the femoral component of the left hip replacement hardware. There is mild displacement. The acetabular component remains in place. The right hip and pelvic  bones are unremarkable. IMPRESSION: Mildly displaced fracture of the proximal femur adjacent to the femoral component of the hip replacement. Electronically Signed   By: Dorise Bullion III M.D   On: 12/06/2017 09:53   Dg Femur Min 2 Views Left  Result Date: 12/06/2017 CLINICAL DATA:  Pain after fall EXAM: LEFT FEMUR 2 VIEWS COMPARISON:  None. FINDINGS: There is a fracture in the proximal femur surrounding the femoral component of a hip replacement. No other fractures identified. IMPRESSION: Fracture of the proximal femur surrounding the femoral component of a hip replacement. See the pelvis/left hip films for further evaluation. Electronically Signed   By: Dorise Bullion III M.D   On: 12/06/2017 09:50   Scheduled Meds: . docusate sodium  100 mg Oral BID  . dorzolamide  1 drop Both Eyes BID  .  HYDROmorphone      . latanoprost  1 drop Both Eyes QHS  . [START ON 12/08/2017] rivaroxaban  10 mg Oral Q breakfast  . spironolactone  25 mg Oral QPM   Continuous Infusions: .  ceFAZolin (ANCEF) IV 1 g (12/07/17 1521)  . methocarbamol (ROBAXIN) IV      LOS: 1 day   Kerney Elbe, DO [facility] PAGER is on AMION  If 7PM-7AM, please contact night-coverage [email or web address] Password [facility] 12/07/2017, 3:45 PM

## 2017-12-07 NOTE — Progress Notes (Signed)
Subjective: [name] [name] complains of significant left hip pain with any attempted motion of the hip   Objective: Vital signs in last 24 hours: Temp:  [97.7 F (36.5 C)-98.8 F (37.1 C)] 98.1 F (36.7 C) (11/29 0557) Pulse Rate:  [66-110] 70 (11/29 0557) Resp:  [13-22] 18 (11/29 0557) BP: (125-158)/(59-97) 127/67 (11/29 0557) SpO2:  [96 %-100 %] 100 % (11/29 0557) Weight:  [44.5 kg] 44.5 kg (11/28 0840)  Intake/Output from previous day: 11/28 0701 - 11/29 0700 In: 1208.8 [I.V.:1208.8] Out: -  Intake/Output this shift: No intake/output data recorded.  Recent Labs    12/06/17 0950 12/07/17 0635  HGB 13.4 11.7*   Recent Labs    12/06/17 0950 12/07/17 0635  WBC 9.0 6.5  RBC 4.42 3.81*  HCT 43.0 38.9  PLT 249 202   Recent Labs    12/06/17 0950  NA 141  K 4.5  CL 112*  CO2 22  BUN 17  CREATININE 0.71  GLUCOSE 123*  CALCIUM 9.5  9.4   No results for input(s): LABPT, INR in the last 72 hours.  Neurologically intact Neurovascular intact Pain on any attempted range of motion of hip; no shortening or angular deformity   Assessment/Plan: Left periprosthetic femur fracture- Fortunately the fracture is minimally displaced and will be amenable to ORIF without having to revise her prosthesis. We discussed the procedure, risks, potential complications and rehab and she would like to proceed with surgical fixation   [name] [name] 12/07/2017, 7:05 AM

## 2017-12-07 NOTE — Anesthesia Procedure Notes (Signed)
Spinal  Patient location during procedure: OR Start time: 12/07/2017 9:37 AM End time: 12/07/2017 9:42 AM Staffing Anesthesiologist: Janeece Riggers, MD Preanesthetic Checklist Completed: patient identified, site marked, surgical consent, pre-op evaluation, timeout performed, IV checked, risks and benefits discussed and monitors and equipment checked Spinal Block Patient position: right lateral decubitus Prep: DuraPrep Patient monitoring: heart rate, cardiac monitor, continuous pulse ox and blood pressure Approach: midline Location: L3-4 Injection technique: single-shot Needle Needle type: Sprotte  Needle gauge: 24 G Needle length: 9 cm Assessment Sensory level: T4

## 2017-12-07 NOTE — Brief Op Note (Signed)
12/07/2017  10:53 AM  PATIENT:  Marvene Staff  77 y.o. female  PRE-OPERATIVE DIAGNOSIS:  left femur fracture  POST-OPERATIVE DIAGNOSIS:  left femur fracture  PROCEDURE:  Procedure(s): OPEN REDUCTION INTERNAL FIXATION (ORIF) DISTAL FEMUR FRACTURE (Left)  SURGEON:  Surgeon(s) and Role:    [name], MD - Primary  PHYSICIAN ASSISTANT:   ASSISTANTS: Theresa Duty, PA-C   ANESTHESIA:   spinal  EBL:  25 mL   BLOOD ADMINISTERED:none  DRAINS: none   LOCAL MEDICATIONS USED:  NONE  COUNTS:  YES  TOURNIQUET:  * No tourniquets in log *  DICTATION: .Other Dictation: Dictation Number [id number]  PLAN OF CARE: Admit to inpatient   PATIENT DISPOSITION:  PACU - hemodynamically stable.

## 2017-12-08 LAB — COMPREHENSIVE METABOLIC PANEL
ALBUMIN: 3.4 g/dL — AB (ref 3.5–5.0)
ALT: 104 U/L — ABNORMAL HIGH (ref 0–44)
AST: 124 U/L — ABNORMAL HIGH (ref 15–41)
Alkaline Phosphatase: 90 U/L (ref 38–126)
Anion gap: 9 (ref 5–15)
BUN: 10 mg/dL (ref 8–23)
CO2: 24 mmol/L (ref 22–32)
Calcium: 8.8 mg/dL — ABNORMAL LOW (ref 8.9–10.3)
Chloride: 104 mmol/L (ref 98–111)
Creatinine, Ser: 0.73 mg/dL (ref 0.44–1.00)
GFR calc Af Amer: >60 mL/min (ref >60)
GFR calc non Af Amer: >60 mL/min (ref >60)
Glucose, Bld: 95 mg/dL (ref 70–99)
Potassium: 3.6 mmol/L (ref 3.5–5.1)
SODIUM: 137 mmol/L (ref 135–145)
Total Bilirubin: 1.5 mg/dL — ABNORMAL HIGH (ref 0.3–1.2)
Total Protein: 6.2 g/dL — ABNORMAL LOW (ref 6.5–8.1)

## 2017-12-08 LAB — VITAMIN D 25 HYDROXY (VIT D DEFICIENCY, FRACTURES): VIT D 25 HYDROXY: 30 ng/mL (ref 30.0–100.0)

## 2017-12-08 LAB — FERRITIN: Ferritin: 136 ng/mL (ref 11–307)

## 2017-12-08 LAB — FOLATE: Folate: >22.4 ng/mL (ref >5.9)

## 2017-12-08 LAB — CBC WITH DIFFERENTIAL/PLATELET
Abs Immature Granulocytes: 0.05 10*3/uL (ref 0.00–0.07)
Basophils Absolute: 0 10*3/uL (ref 0.0–0.1)
Basophils Relative: 1 %
Eosinophils Absolute: 0.1 10*3/uL (ref 0.0–0.5)
Eosinophils Relative: 2 %
HCT: 37.7 % (ref 36.0–46.0)
Hemoglobin: 11.4 g/dL — ABNORMAL LOW (ref 12.0–15.0)
IMMATURE GRANULOCYTES: 1 %
Lymphocytes Relative: 19 %
Lymphs Abs: 1 10*3/uL (ref 0.7–4.0)
MCH: 31.4 pg (ref 26.0–34.0)
MCHC: 30.2 g/dL (ref 30.0–36.0)
MCV: 103.9 fL — ABNORMAL HIGH (ref 80.0–100.0)
Monocytes Absolute: 0.7 10*3/uL (ref 0.1–1.0)
Monocytes Relative: 13 %
Neutro Abs: 3.7 10*3/uL (ref 1.7–7.7)
Neutrophils Relative %: 64 %
Platelets: 188 10*3/uL (ref 150–400)
RBC: 3.63 MIL/uL — AB (ref 3.87–5.11)
RDW: 13.1 % (ref 11.5–15.5)
WBC: 5.6 10*3/uL (ref 4.0–10.5)
nRBC: 0 % (ref 0.0–0.2)

## 2017-12-08 LAB — IRON AND TIBC
Iron: 31 ug/dL (ref 28–170)
Saturation Ratios: 10 % — ABNORMAL LOW (ref 10.4–31.8)
TIBC: 322 ug/dL (ref 250–450)
UIBC: 291 ug/dL

## 2017-12-08 LAB — RETICULOCYTES
Immature Retic Fract: 18.2 % — ABNORMAL HIGH (ref 2.3–15.9)
RBC.: 3.63 MIL/uL — ABNORMAL LOW (ref 3.87–5.11)
Retic Count, Absolute: 76.2 10*3/uL (ref 19.0–186.0)
Retic Ct Pct: 2.1 % (ref 0.4–3.1)

## 2017-12-08 LAB — MAGNESIUM: Magnesium: 1.9 mg/dL (ref 1.7–2.4)

## 2017-12-08 LAB — VITAMIN B12: Vitamin B-12: 590 pg/mL (ref 180–914)

## 2017-12-08 LAB — PHOSPHORUS: Phosphorus: 2.2 mg/dL — ABNORMAL LOW (ref 2.5–4.6)

## 2017-12-08 NOTE — Progress Notes (Signed)
Physical Therapy Treatment Patient Details Name: [name] [name] MRN: [id number] DOB: Aug 09, 1940 Today's Date: 12/08/2017    History of Present Illness 77 YO female s/p L DA-THA on 10/17/17, admitted 11/28 for fall resulting in L periprosthetic fracture. Pt is s/p ORIF of L periprosthetic femur fracture on 12/07/17. PMH includes glaucoma, edema, OA, IBS, TB, osteopenia, cataracts.     PT Comments    Patient progressing with ambulation distance, but with significant pain with weight bearing and several stops in hallway.  Discussed increased fall risk with these issues, and possible need for SNF level rehab prior to d/c home.  PT to follow acutely.   Follow Up Recommendations  SNF;Supervision/Assistance - 24 hour     Equipment Recommendations  None recommended by PT    Recommendations for Other Services       Precautions / Restrictions Precautions Precautions: Fall Restrictions Weight Bearing Restrictions: No RLE Weight Bearing: Weight bearing as tolerated LLE Weight Bearing: Weight bearing as tolerated    Mobility  Bed Mobility Overal bed mobility: Needs Assistance Bed Mobility: Supine to Sit     Supine to sit: Min assist;HOB elevated     General bed mobility comments: assist to support L LE as pt scooting out HOB about 50 and patient did not sit on EOB just stood from EOB rather than bend L knee/hip at EOB  Transfers Overall transfer level: Needs assistance Equipment used: Rolling walker (2 wheeled) Transfers: Sit to/from Stand Sit to Stand: Min assist         General transfer comment: assist for balance, increased time, cues for hand placement; increased time and pain to sit  Ambulation/Gait Ambulation/Gait assistance: Min assist Gait Distance (Feet): 90 Feet Assistive device: Rolling walker (2 wheeled) Gait Pattern/deviations: Step-to pattern;Decreased stride length;Antalgic;Shuffle;Decreased weight shift to left     General Gait Details: increased time,  stops to rest several times in hallway, pain with each step on R due to weight bearing L; fatigued with pain, but pushing herself   Stairs             Wheelchair Mobility    Modified Rankin (Stroke Patients Only)       Balance Overall balance assessment: Needs assistance Sitting-balance support: Feet supported Sitting balance-Leahy Scale: Good     Standing balance support: Bilateral upper extremity supported Standing balance-Leahy Scale: Poor Standing balance comment: pain with weight bearing L so unable to stand without UE support                            Cognition Arousal/Alertness: Awake/alert Behavior During Therapy: WFL for tasks assessed/performed Overall Cognitive Status: Within Functional Limits for tasks assessed                                        Exercises Total Joint Exercises Ankle Circles/Pumps: AROM;Both;Seated;10 reps Quad Sets: AROM;Left;10 reps;Supine Heel Slides: AROM;AAROM;Left;10 reps;Supine Hip ABduction/ADduction: AROM;Left;10 reps;Supine    General Comments        Pertinent Vitals/Pain Pain Assessment: Faces Faces Pain Scale: Hurts even more Pain Location: L hip/knee with weight bearing, moving it off EOB Pain Descriptors / Indicators: Grimacing;Guarding;Sore Pain Intervention(s): Monitored during session;Repositioned;Ice applied    Home Living                      Prior Function  PT Goals (current goals can now be found in the care plan section) Progress towards PT goals: Progressing toward goals    Frequency    Min 3X/week      PT Plan Discharge plan needs to be updated;Frequency needs to be updated    Co-evaluation              AM-PAC PT "6 Clicks" Mobility   Outcome Measure  Help needed turning from your back to your side while in a flat bed without using bedrails?: A Little Help needed moving from lying on your back to sitting on the side of a flat bed  without using bedrails?: A Little Help needed moving to and from a bed to a chair (including a wheelchair)?: A Little Help needed standing up from a chair using your arms (e.g., wheelchair or bedside chair)?: A Little Help needed to walk in hospital room?: A Little Help needed climbing 3-5 steps with a railing? : A Lot 6 Click Score: 17    End of Session Equipment Utilized During Treatment: Gait belt Activity Tolerance: Patient limited by pain Patient left: with call bell/phone within reach;in chair;with chair alarm set   PT Visit Diagnosis: Other abnormalities of gait and mobility (R26.89);Difficulty in walking, not elsewhere classified (R26.2);Pain Pain - Right/Left: Left Pain - part of body: Hip     Time: 4098-1191 PT Time Calculation (min) (ACUTE ONLY): 30 min  Charges:  $Gait Training: 8-22 mins $Therapeutic Exercise: 8-22 mins                     Magda Kiel, [place] [facility] [phone] 12/08/2017    Reginia Naas 12/08/2017, 11:40 AM

## 2017-12-08 NOTE — NC FL2 (Signed)
[place] LEVEL OF CARE SCREENING TOOL     IDENTIFICATION  Patient Name: [name] [name] Birthdate: Dec 27, 1940 Sex: female Admission Date (Current Location): 12/06/2017  [facility] and [place] Number:  Herbalist and Address:  [facility],  [place], [place]      Provider Number: [id number]  Attending Physician Name and Address:  Cristal Ford, DO  Relative Name and Phone Number:  Jarrah Seher: [phone]    Current Level of Care: Hospital Recommended Level of Care: [place] Prior Approval Number:    Date Approved/Denied:   PASRR Number: [id number]  Discharge Plan: SNF    Current Diagnoses: Patient Active Problem List   Diagnosis Date Noted  . S/p left hip fracture 12/06/2017  . Sinus tachycardia 12/06/2017  . OA (osteoarthritis) of hip 10/17/2017  . Glaucoma 05/26/2013  . Edema 05/16/2012  . OSTEOARTHRITIS 09/11/2008  . Vitamin D deficiency 04/16/2008  . REACTIVE AIRWAY DISEASE 04/16/2008  . Hyperlipidemia 09/02/2007  . 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 IBS 04/16/2006  . Osteopenia 04/16/2006  . HX, PERSONAL, TUBERCULOSIS 04/16/2006    Orientation RESPIRATION BLADDER Height & Weight     Self, Time, Situation, Place  Normal Continent Weight: 98 lb (44.5 kg) Height:  5' (152.4 cm)  BEHAVIORAL SYMPTOMS/MOOD NEUROLOGICAL BOWEL NUTRITION STATUS      Continent Diet(Regular)  AMBULATORY STATUS COMMUNICATION OF NEEDS Skin   Limited Assist Verbally Surgical wounds(surgical incision L hip)                       Personal Care Assistance Level of Assistance  Bathing, Feeding, Dressing Bathing Assistance: Limited assistance Feeding assistance: Independent Dressing Assistance: Limited assistance     Functional Limitations Info  Speech, Hearing, Sight Sight Info: Adequate Hearing Info: Adequate Speech Info: Adequate    SPECIAL CARE FACTORS FREQUENCY  OT (By licensed OT), PT (By  licensed PT)     PT Frequency: 5x/week OT Frequency: 5x/week            Contractures Contractures Info: Not present    Additional Factors Info  Code Status, Allergies Code Status Info: Full Allergies Info: BEE VENOM, ASPIRIN, LATEX, OTHER            Current Medications (12/08/2017):  This is the current hospital active medication list Current Facility-Administered Medications  Medication Dose Route Frequency Provider Last Rate Last Dose  . bisacodyl (DULCOLAX) suppository 10 mg  10 mg Rectal Daily PRN [name], MD      . diphenhydrAMINE (BENADRYL) 12.5 MG/5ML elixir 12.5-25 mg  12.5-25 mg Oral Q4H PRN Aluisio, Pilar Plate, MD      . docusate sodium (COLACE) capsule 100 mg  100 mg Oral BID [name], MD   100 mg at 12/08/17 1046  . dorzolamide (TRUSOPT) 2 % ophthalmic solution 1 drop  1 drop Both Eyes BID [name], MD   1 drop at 12/08/17 0600  . hydrALAZINE (APRESOLINE) injection 5 mg  5 mg Intravenous Q6H PRN Aluisio, Pilar Plate, MD      . HYDROcodone-acetaminophen (NORCO/VICODIN) 5-325 MG per tablet 1-2 tablet  1-2 tablet Oral Q6H PRN [name], MD   2 tablet at 12/08/17 0744  . latanoprost (XALATAN) 0.005 % ophthalmic solution 1 drop  1 drop Both Eyes QHS [name], MD   1 drop at 12/07/17 2211  . menthol-cetylpyridinium (CEPACOL) lozenge 3 mg  1 lozenge Oral PRN [name], MD  Or  . phenol (CHLORASEPTIC) mouth spray 1 spray  1 spray Mouth/Throat PRN Aluisio, Pilar Plate, MD      . methocarbamol (ROBAXIN) 500 mg in dextrose 5 % 50 mL IVPB  500 mg Intravenous Q6H PRN Aluisio, Pilar Plate, MD      . methocarbamol (ROBAXIN) tablet 500 mg  500 mg Oral Q6H PRN [name], MD   500 mg at 12/08/17 0744  . metoCLOPramide (REGLAN) tablet 5-10 mg  5-10 mg Oral Q8H PRN [name], MD       Or  . metoCLOPramide (REGLAN) injection 5-10 mg  5-10 mg Intravenous Q8H PRN Aluisio, Pilar Plate, MD      . morphine 2 MG/ML injection 0.5 mg  0.5 mg Intravenous Q2H PRN [name], MD   0.5 mg at 12/06/17 1633  . ondansetron (ZOFRAN) tablet 4 mg  4 mg Oral Q6H PRN [name], MD       Or  . ondansetron (ZOFRAN) injection 4 mg  4 mg Intravenous Q6H PRN Aluisio, Pilar Plate, MD      . polyethylene glycol (MIRALAX / GLYCOLAX) packet 17 g  17 g Oral Daily PRN Aluisio, Pilar Plate, MD      . polyvinyl alcohol (LIQUIFILM TEARS) 1.4 % ophthalmic solution 1 drop  1 drop Both Eyes PRN Aluisio, Pilar Plate, MD      . rivaroxaban Alveda Reasons) tablet 10 mg  10 mg Oral Q breakfast [name], MD   10 mg at 12/08/17 0744  . senna-docusate (Senokot-S) tablet 1 tablet  1 tablet Oral QHS PRN Aluisio, Pilar Plate, MD      . sodium phosphate (FLEET) 7-19 GM/118ML enema 1 enema  1 enema Rectal Once PRN Aluisio, Pilar Plate, MD      . spironolactone (ALDACTONE) tablet 25 mg  25 mg Oral QPM [name], MD   25 mg at 12/07/17 2203  . traMADol (ULTRAM) tablet 50-100 mg  50-100 mg Oral Q6H PRN [name], MD   100 mg at 12/08/17 1049     Discharge Medications: Please see discharge summary for a list of discharge medications.  Relevant Imaging Results:  Relevant Lab Results:   Additional Information SSN: [id number]  Pricilla Holm, [place]

## 2017-12-08 NOTE — Clinical Social Work Note (Signed)
Clinical Social Work Assessment  Patient Details  Name: [name] [name] MRN: [id number] Date of Birth: 1940/10/30  Date of referral:  12/06/17               Reason for consult:  Facility Placement                Permission sought to share information with:  Facility Art therapist granted to share information::  Yes, Verbal Permission Granted  Name::     [name] [name]  Agency::  SNF  Relationship::  Daughter  Contact Information:  [phone]  Housing/Transportation Living arrangements for the past 2 months:  Single Family Home Source of Information:  Patient Patient Interpreter Needed:  None Criminal Activity/Legal Involvement Pertinent to Current Situation/Hospitalization:  No - Comment as needed Significant Relationships:  Adult Children, Spouse Lives with:  Spouse Do you feel safe going back to the place where you live?  Yes Need for family participation in patient care:  No (Coment)  Care giving concerns:  Patient presents post fall resulting in L periprosthetic fracture. Pt is s/p ORIF of L periprosthetic femur fracture on 12/07/17. Per patient, she lives at home with spouse who has cognitive impairment and cannot provided needed support. She has many steps to climb to reach bedroom and cannot navigate these safely. Will need short term therapy to regain strength before returning home.    Social Worker assessment / plan:  CSW met with patient at bedside to discuss plan for discharge and SNF referral process. Patient states that when she had her hip replacement surgery previously, she paid out of pocket to stay in hotel while getting home health therapy due to being unable to safely navigate steps in home. Patient has spouse at home who cannot provided needed support and daughter who lives in [place]. Patient expresses concerns regarding falling at home and is agreeable to SNF at discharge.  At this time, patient is planning to d/c to SNF but states she would be  agreeable to d/c home if she feels she can safely ambulate at time of d/c. CSW agreed to begin SNF process in case that will be d/c plan.   Patient prefers [facility] due to proximity to her home. Patient gave permission for CSW to send info to other area facilities in case [facility] cannot accept.  CSW will complete FL2 and fax out to facilities.   Employment status:  Retired Forensic scientist:  Medicare PT Recommendations:  [place] / Referral to community resources:  [place]  Patient/Family's Response to care:  Patient was appreciative of CSW involvement. She expressed concern with potentially falling at home due to many steps and lack of support.   Patient/Family's Understanding of and Emotional Response to Diagnosis, Current Treatment, and Prognosis:  Patient understands SNF v HH. She has visited [facility] before and has an idea of what to expect at [facility]. She understands process and knows CSW will f/u.   Emotional Assessment Appearance:  Appears older than stated age Attitude/Demeanor/Rapport:  Engaged Affect (typically observed):  Accepting, Calm, Appropriate Orientation:  Oriented to Self, Oriented to Place, Oriented to  Time, Oriented to Situation Alcohol / Substance use:  Not Applicable Psych involvement (Current and /or in the community):  No (Comment)  Discharge Needs  Concerns to be addressed:  Care Coordination Readmission within the last 30 days:  No Current discharge risk:  Physical Impairment Barriers to Discharge:  Continued Medical Work up   [employer], [place] 12/08/2017,  11:52 AM

## 2017-12-08 NOTE — Care Management Important Message (Signed)
Important Message  Patient Details  Name: [name] [name] MRN: [id number] Date of Birth: 02-Apr-1940   Medicare Important Message Given:  Yes    Erenest Rasher, RN 12/08/2017, 11:36 AM

## 2017-12-08 NOTE — Progress Notes (Signed)
Patient refused pulse oximetry monitoring despite education and explanation. Will monitor closely.

## 2017-12-08 NOTE — Progress Notes (Signed)
   Subjective: 1 Day Post-Op Procedure(s) (LRB): OPEN REDUCTION INTERNAL FIXATION (ORIF) DISTAL FEMUR FRACTURE (Left)  Pt c/o moderate pain in the left hip today Denies any new symptoms or issues We discussed therapy and d/c planning once able Denies any numbness or tingling distally Patient reports pain as moderate.  Objective:   VITALS:   Vitals:   12/08/17 0301 12/08/17 0523  BP: (!) 144/67 (!) 141/63  Pulse: 81 89  Resp: 14 16  Temp: 98.7 F (37.1 C) 99.3 F (37.4 C)  SpO2: 100% 98%    Left hip incision healing well No drainage or erythema nv intact distally No rashes or edema distally  LABS Recent Labs    12/06/17 0950 12/07/17 0635 12/08/17 0543  HGB 13.4 11.7* 11.4*  HCT 43.0 38.9 37.7  WBC 9.0 6.5 5.6  PLT 249 202 188    Recent Labs    12/06/17 0950 12/07/17 0635 12/08/17 0543  NA 141 141 137  K 4.5 4.2 3.6  BUN 17 13 10   CREATININE 0.71 0.74 0.73  GLUCOSE 123* 104* 95     Assessment/Plan: 1 Day Post-Op Procedure(s) (LRB): OPEN REDUCTION INTERNAL FIXATION (ORIF) DISTAL FEMUR FRACTURE (Left) Pain management PT/OT D/c planning for 2-3 days Pt in agreement    Brad Dixon PA-C, [address] [facility] [address] [employer] [address] [place], [place], [place] [address] Phone: [phone] [email or web address] Facebook  [employer]

## 2017-12-08 NOTE — Plan of Care (Addendum)
Pt stable with no needs. No changes needed to care plans. No s/s of distress this am. Pt to possibly d/c tomorrow.

## 2017-12-08 NOTE — Plan of Care (Signed)
  Problem: Health Behavior/Discharge Planning: Goal: Ability to manage health-related needs will improve Outcome: Progressing   Problem: Clinical Measurements: Goal: Ability to maintain clinical measurements within normal limits will improve Outcome: Progressing Goal: Will remain free from infection Outcome: Progressing Goal: Diagnostic test results will improve Outcome: Progressing Goal: Respiratory complications will improve Outcome: Progressing   Problem: Activity: Goal: Risk for activity intolerance will decrease Outcome: Progressing   Problem: Coping: Goal: Level of anxiety will decrease Outcome: Progressing   Problem: Elimination: Goal: Will not experience complications related to bowel motility Outcome: Progressing Goal: Will not experience complications related to urinary retention Outcome: Progressing   Problem: Activity: Goal: Ability to ambulate and perform ADLs will improve Outcome: Progressing   Problem: Clinical Measurements: Goal: Postoperative complications will be avoided or minimized Outcome: Progressing   Problem: Self-Concept: Goal: Ability to maintain and perform role responsibilities to the fullest extent possible will improve Outcome: Progressing

## 2017-12-08 NOTE — Progress Notes (Signed)
PROGRESS NOTE    [name] [name]  XFG:[id number] DOB: 1940-02-28 DOA: 12/06/2017 PCP: Velna Hatchet, MD   Brief Narrative:  HPI on 12/06/2017 by Dr. Oretha Milch [name] [name] is a 77 y.o. 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  She landed on her left hip without hitting her head or losing consciousness. Her husband helped her up and got her upstairs. The pain in her leg started later during the night whenever she moved. She tried to get up this morning but couldn't bear weight to go to the bathroom. She called her orthopedics doctor on call who suggested she come to [facility] ED She had been in her usual state of health prior to that. Denies any no fevers, chills, no chest pain. No dyspnea. No lightheadedness or dizziness.  She has been doing well since her recent left hip replacement. She has returned to the gym doing resistance work (about 20 lbs with her personal trainer since the procedure) and light training with no complications.  She was on her Xarelto for about 2 weeks after discharge for DVT prophylaxis.  She followed up by baby aspirin for the remaining week.  She reports intolerance to higher dose of aspirin with severe abdominal pain.  She has been off both agents since 11/21 History of Osteoporosis. She had been on fosamax for about 5 years. Evista was added in 2010-2011. She hasn't been on anything other than vitamin D and calcium. Her mother and sister both have osteopenia. No history of chronic prednisone therapy.   Interim history Left proximal femur fracture status post surgery.  Likely will need SNF. Assessment & Plan   Left proximal femur fracture -Related to mechanical fall.  Patient recently had left hip total  arthroplasty due to osteoarthritis in October 2019. -Orthopedics consulted and appreciated, status post ORIF -Continue pain control and bowel regimen -PT consulted, recommending SNF (Had long discussion with patient, she does agree that rehab is probably better for her than home health given that her husband has cognitive impairment and she does have 12 steps up to her bedroom.  She also has no help at home or family in the area.) -Currently on Xarelto for DVT prophylaxis  Macrocytic anemia/acute blood loss anemia due to surgery -Hemoglobin currently stable, 11.4 -continue to monitor CBC -Anemia panel shows adequate iron in storage  Elevated BP -Suspect secondary to pain -Patient has no history or diagnosis of hypertension -Continue medications as needed, spironolactone nightly  Sinus tachycardia  -Likely related to pain and dehydration, appears to be stable and resolved  Hyperlipidemia -Has been off of statin since June due to improvement of LDL -Follow-up with PCP on discharge  Osteoarthritis left hip -As above, status post total hip arthroplasty 10/17/2017 by Dr. Wynelle Link  Dry eyes/glaucoma -Continue eyedrops  IBS -Stable  Osteoarthritis -History of Fosamax and tamoxifen in the past -Follow-up with PCP  DVT Prophylaxis  Xarelto   Code Status: Full  Family Communication: None at bedside  Disposition Plan: Admitted. Will likely need SNF.  Consultants Orthopedic surgery, Dr. Wynelle Link  Procedures  Open reduction internal fixation of left periprosthetic femur fracture done by Dr. Wynelle Link on 12/07/17  Antibiotics   Anti-infectives (From admission, onward)   Start     Dose/Rate Route Frequency Ordered  Stop   12/07/17 1600  ceFAZolin (ANCEF) IVPB 1 g/50 mL premix     1 g 100 mL/hr over 30 Minutes Intravenous Every 6 hours 12/07/17 1247 12/07/17 2234   12/07/17 1100  ceFAZolin (ANCEF) IVPB 2g/100 mL premix     2 g 200 mL/hr over 30 Minutes Intravenous  Once 12/07/17  0709 12/07/17 1009      Subjective:   [name] [name] seen and examined today.  Complains of left hip pain today.  Denies current chest pain, shortness of breath, abdominal pain, nausea or vomiting, diarrhea or constipation, dizziness or headache. Objective:   Vitals:   12/07/17 1652 12/07/17 2129 12/08/17 0301 12/08/17 0523  BP: (!) 143/68 (!) 143/61 (!) 144/67 (!) 141/63  Pulse: 77 75 81 89  Resp: 16 14 14 16   Temp: 98.1 F (36.7 C) 98.2 F (36.8 C) 98.7 F (37.1 C) 99.3 F (37.4 C)  TempSrc: Oral Oral Oral Oral  SpO2: 100% 99% 100% 98%  Weight:      Height:        Intake/Output Summary (Last 24 hours) at 12/08/2017 1148 Last data filed at 12/08/2017 1123 Gross per 24 hour  Intake 1020 ml  Output 1750 ml  Net -730 ml   Filed Weights   12/06/17 0840  Weight: 44.5 kg    Exam  General: Well developed, well nourished, thin, NAD, appears stated age  70: NCAT, mucous membranes moist.   Neck: Supple  Cardiovascular: S1 S2 auscultated, no murmurs, RRR  Respiratory: Clear to auscultation bilaterally with equal chest rise  Abdomen: Soft, nontender, nondistended, + bowel sounds  Extremities: warm dry without cyanosis clubbing or edema  Neuro: AAOx3, nonfocal  Psych: Normal affect and demeanor    Data Reviewed: I have personally reviewed following labs and imaging studies  CBC: Recent Labs  Lab 12/06/17 0950 12/07/17 0635 12/08/17 0543  WBC 9.0 6.5 5.6  NEUTROABS 7.7  --  3.7  HGB 13.4 11.7* 11.4*  HCT 43.0 38.9 37.7  MCV 97.3 102.1* 103.9*  PLT 249 202 875   Basic Metabolic Panel: Recent Labs  Lab 12/06/17 0950 12/07/17 0635 12/08/17 0543  NA 141 141 137  K 4.5 4.2 3.6  CL 112* 111 104  CO2 22 26 24   GLUCOSE 123* 104* 95  BUN 17 13 10   CREATININE 0.71 0.74 0.73  CALCIUM 9.5  9.4 8.7* 8.8*  MG  --   --  1.9  PHOS  --   --  2.2*   GFR: Estimated Creatinine Clearance: 42 mL/min (by C-G formula based on SCr of 0.73 mg/dL). Liver  Function Tests: Recent Labs  Lab 12/06/17 0950 12/08/17 0543  AST  --  124*  ALT  --  104*  ALKPHOS  --  90  BILITOT  --  1.5*  PROT  --  6.2*  ALBUMIN 4.5 3.4*   No results for input(s): LIPASE, AMYLASE in the last 168 hours. No results for input(s): AMMONIA in the last 168 hours. Coagulation Profile: No results for input(s): INR, PROTIME in the last 168 hours. Cardiac Enzymes: No results for input(s): CKTOTAL, CKMB, CKMBINDEX, TROPONINI in the last 168 hours. BNP (last 3 results) No results for input(s): PROBNP in the last 8760 hours. HbA1C: No results for input(s): HGBA1C in the last 72 hours. CBG: No results for input(s): GLUCAP in the last 168 hours. Lipid Profile: No results for input(s): CHOL, HDL, LDLCALC, TRIG, CHOLHDL, LDLDIRECT in the last 72 hours. Thyroid Function Tests: No results  for input(s): TSH, T4TOTAL, FREET4, T3FREE, THYROIDAB in the last 72 hours. Anemia Panel: Recent Labs    12/08/17 0543  VITAMINB12 590  FOLATE >22.4  FERRITIN 136  TIBC 322  IRON 31  RETICCTPCT 2.1   Urine analysis:    Component Value Date/Time   COLORURINE yellow 09/02/2007 0923   APPEARANCEUR Clear 09/02/2007 0923   LABSPEC >=1.030 09/02/2007 0923   PHURINE 5.0 09/02/2007 0923   HGBUR negative 09/02/2007 0923   BILIRUBINUR negative 09/02/2007 0923   UROBILINOGEN 0.2 09/02/2007 0923   NITRITE negative 09/02/2007 0923   Sepsis Labs: @LABRCNTIP (procalcitonin:4,lacticidven:4)  )No results found for this or any previous visit (from the past 240 hour(s)).    Radiology Studies: Dg Pelvis Portable  Result Date: 12/07/2017 CLINICAL DATA:  Hip replacement. EXAM: PORTABLE PELVIS 1-2 VIEWS COMPARISON:  10/17/2017. FINDINGS: Total left hip replacement. Hardware intact. No acute bony abnormality identified. Degenerative change lumbar spine and right hip. IMPRESSION: Total left hip replacement. Hardware intact. No acute abnormality identified. Electronically Signed   By: Marcello Moores   Register   On: 12/07/2017 11:19     Scheduled Meds: . docusate sodium  100 mg Oral BID  . dorzolamide  1 drop Both Eyes BID  . latanoprost  1 drop Both Eyes QHS  . rivaroxaban  10 mg Oral Q breakfast  . spironolactone  25 mg Oral QPM   Continuous Infusions: . methocarbamol (ROBAXIN) IV       LOS: 2 days   Time Spent in minutes   30 minutes  Ravleen Ries D.O. on 12/08/2017 at 11:48 AM  Between 7am to 7pm - Please see pager noted on [email or web address]  After 7pm go to [email or web address]  And look for the night coverage person covering for me after hours  [facility] Office  [phone]

## 2017-12-08 NOTE — Care Management Note (Signed)
Case Management Note  Patient Details  Name: [name] [name] MRN: [id number] Date of Birth: 01-07-1941  Subjective/Objective:    Distal femur fx from fall, s/p ORIF               Action/Plan: Spoke to pt and states she wants to go to SNF rehab at [place] she has 13 stairs in her home and continues with pain and minimal weight bearing. CSW referral for SNF Rehab, prefers [facility]. Has RW and bedside commode at home from recent hip surgery.   Expected Discharge Date:  12/07/17               Expected Discharge Plan:  [place]  In-House Referral:  Clinical Social Work  Discharge planning Services  CM Consult  Post Acute Care Choice:  NA Choice offered to:  NA  DME Arranged:  N/A DME Agency:  NA  HH Arranged:  NA HH Agency:  NA  Status of Service:  Completed, signed off  If discussed at [employer] of Stay Meetings, dates discussed:    Additional Comments:  Erenest Rasher, RN 12/08/2017, 11:32 AM

## 2017-12-09 LAB — BASIC METABOLIC PANEL
Anion gap: 6 (ref 5–15)
BUN: 9 mg/dL (ref 8–23)
CO2: 26 mmol/L (ref 22–32)
Calcium: 8.6 mg/dL — ABNORMAL LOW (ref 8.9–10.3)
Chloride: 106 mmol/L (ref 98–111)
Creatinine, Ser: 0.68 mg/dL (ref 0.44–1.00)
GFR calc Af Amer: >60 mL/min (ref >60)
GFR calc non Af Amer: >60 mL/min (ref >60)
Glucose, Bld: 103 mg/dL — ABNORMAL HIGH (ref 70–99)
Potassium: 4.1 mmol/L (ref 3.5–5.1)
Sodium: 138 mmol/L (ref 135–145)

## 2017-12-09 LAB — CBC
HCT: 37.5 % (ref 36.0–46.0)
Hemoglobin: 11.4 g/dL — ABNORMAL LOW (ref 12.0–15.0)
MCH: 30.2 pg (ref 26.0–34.0)
MCHC: 30.4 g/dL (ref 30.0–36.0)
MCV: 99.5 fL (ref 80.0–100.0)
PLATELETS: 200 10*3/uL (ref 150–400)
RBC: 3.77 MIL/uL — ABNORMAL LOW (ref 3.87–5.11)
RDW: 12.9 % (ref 11.5–15.5)
WBC: 5.9 10*3/uL (ref 4.0–10.5)
nRBC: 0 % (ref 0.0–0.2)

## 2017-12-09 MED ORDER — ENSURE ENLIVE PO LIQD
237.0000 mL | Freq: Two times a day (BID) | ORAL | Status: DC
  Administered 2017-12-10: 237 mL via ORAL

## 2017-12-09 NOTE — Progress Notes (Signed)
Physical Therapy Treatment Patient Details Name: [name] [name] MRN: [id number] DOB: 10/23/1940 Today's Date: 12/09/2017    History of Present Illness 77 YO female s/p L DA-THA on 10/17/17, admitted 11/28 for fall resulting in L periprosthetic fracture. Pt is s/p ORIF of L periprosthetic femur fracture on 12/07/17. PMH includes glaucoma, edema, OA, IBS, TB, osteopenia, cataracts.     PT Comments    Patient progressing with confidence with ambulation, though still fatigues with UE's due to taking weight off L for ambulation.   Continue to recommend SNF level rehab upon d/c.   Follow Up Recommendations  SNF;Supervision/Assistance - 24 hour     Equipment Recommendations  None recommended by PT    Recommendations for Other Services       Precautions / Restrictions Precautions Precautions: Fall Restrictions RLE Weight Bearing: Weight bearing as tolerated    Mobility  Bed Mobility               General bed mobility comments: up in chair  Transfers Overall transfer level: Needs assistance   Transfers: Sit to/from Stand Sit to Stand: Min assist         General transfer comment: assist for balance, safety  Ambulation/Gait Ambulation/Gait assistance: Min assist Gait Distance (Feet): 90 Feet Assistive device: Rolling walker (2 wheeled) Gait Pattern/deviations: Step-to pattern;Decreased stride length;Step-through pattern;Antalgic     General Gait Details: increased weight on walker stepping with R, assist for balance/safety   Stairs             Wheelchair Mobility    Modified Rankin (Stroke Patients Only)       Balance Overall balance assessment: Needs assistance Sitting-balance support: Feet supported Sitting balance-Leahy Scale: Good     Standing balance support: Bilateral upper extremity supported Standing balance-Leahy Scale: Poor Standing balance comment: stood without UE support to lean over a fix her chair pad with PT providing minguard A,  educated in safety and need to slow down and accept assist to avoid further falls                            Cognition Arousal/Alertness: Awake/alert Behavior During Therapy: WFL for tasks assessed/performed Overall Cognitive Status: Within Functional Limits for tasks assessed                                        Exercises Total Joint Exercises Ankle Circles/Pumps: AROM;Both;Seated;10 reps Quad Sets: AROM;Left;10 reps;Seated Heel Slides: AROM;AAROM;Left;10 reps;Seated(foot on floor) Hip ABduction/ADduction: AROM;PROM;10 reps;Seated;Left    General Comments        Pertinent Vitals/Pain Pain Score: 5  Pain Location: L hip/knee  Pain Descriptors / Indicators: Operative site guarding;Sore Pain Intervention(s): Monitored during session;Repositioned;Ice applied    Home Living                      Prior Function            PT Goals (current goals can now be found in the care plan section) Progress towards PT goals: Progressing toward goals    Frequency    Min 3X/week      PT Plan Current plan remains appropriate    Co-evaluation              AM-PAC PT "6 Clicks" Mobility   Outcome Measure  Help needed turning from your back to  your side while in a flat bed without using bedrails?: A Little Help needed moving from lying on your back to sitting on the side of a flat bed without using bedrails?: A Little Help needed moving to and from a bed to a chair (including a wheelchair)?: A Little Help needed standing up from a chair using your arms (e.g., wheelchair or bedside chair)?: A Little Help needed to walk in hospital room?: A Little Help needed climbing 3-5 steps with a railing? : A Lot 6 Click Score: 17    End of Session Equipment Utilized During Treatment: Gait belt Activity Tolerance: Patient limited by pain Patient left: with call bell/phone within reach;in chair   PT Visit Diagnosis: Other abnormalities of gait and  mobility (R26.89);Difficulty in walking, not elsewhere classified (R26.2);Pain Pain - Right/Left: Left Pain - part of body: Hip     Time: 1115-1140 PT Time Calculation (min) (ACUTE ONLY): 25 min  Charges:  $Gait Training: 8-22 mins $Therapeutic Exercise: 8-22 mins                     Magda Kiel, [place] [facility] [phone] 12/09/2017    Reginia Naas 12/09/2017, 3:53 PM

## 2017-12-09 NOTE — Progress Notes (Signed)
PROGRESS NOTE    [name] [name]  WCB:[id number] DOB: 09/24/1940 DOA: 12/06/2017 PCP: Velna Hatchet, MD   Brief Narrative:  HPI on 12/06/2017 by Dr. Oretha Milch [name] [name] is a 77 y.o. 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  She landed on her left hip without hitting her head or losing consciousness. Her husband helped her up and got her upstairs. The pain in her leg started later during the night whenever she moved. She tried to get up this morning but couldn't bear weight to go to the bathroom. She called her orthopedics doctor on call who suggested she come to [facility] ED She had been in her usual state of health prior to that. Denies any no fevers, chills, no chest pain. No dyspnea. No lightheadedness or dizziness.  She has been doing well since her recent left hip replacement. She has returned to the gym doing resistance work (about 20 lbs with her personal trainer since the procedure) and light training with no complications.  She was on her Xarelto for about 2 weeks after discharge for DVT prophylaxis.  She followed up by baby aspirin for the remaining week.  She reports intolerance to higher dose of aspirin with severe abdominal pain.  She has been off both agents since 11/21 History of Osteoporosis. She had been on fosamax for about 5 years. Evista was added in 2010-2011. She hasn't been on anything other than vitamin D and calcium. Her mother and sister both have osteopenia. No history of chronic prednisone therapy.   Interim history Left proximal femur fracture status post surgery.  Likely will need SNF. Assessment & Plan   Left proximal femur fracture -Related to mechanical fall.  Patient recently had left hip total  arthroplasty due to osteoarthritis in October 2019. -Orthopedics consulted and appreciated, status post ORIF -Continue pain control and bowel regimen -PT consulted, recommending SNF (Had long discussion with patient, she does agree that rehab is probably better for her than home health given that her husband has cognitive impairment and she does have 12 steps up to her bedroom.  She also has no help at home or family in the area.) -Currently on Xarelto for DVT prophylaxis -Social work consulted   Macrocytic anemia/acute blood loss anemia due to surgery -Hemoglobin stable, currently 11.4 -continue to monitor CBC -Anemia panel shows adequate iron in storage  Elevated BP -Suspect secondary to pain -Patient has no history or diagnosis of hypertension -Continue medications as needed, spironolactone nightly  Sinus tachycardia  -Likely related to pain and dehydration, appears to be stable and resolved  Hyperlipidemia -Has been off of statin since June due to improvement of LDL -Follow-up with PCP on discharge  Osteoarthritis left hip -As above, status post total hip arthroplasty 10/17/2017 by Dr. Wynelle Link  Dry eyes/glaucoma -Continue eyedrops  IBS -Stable  Osteoarthritis -History of Fosamax and tamoxifen in the past -Follow-up with PCP  DVT Prophylaxis  Xarelto   Code Status: Full  Family Communication: None at bedside  Disposition Plan: Admitted. SNF, when bed available.   Consultants Orthopedic surgery, Dr. Wynelle Link  Procedures  Open reduction internal fixation of left periprosthetic femur fracture done by Dr. Wynelle Link on 12/07/17  Antibiotics   Anti-infectives (From admission, onward)   Start  Dose/Rate Route Frequency Ordered Stop   12/07/17 1600  ceFAZolin (ANCEF) IVPB 1 g/50 mL premix     1 g 100 mL/hr over 30 Minutes Intravenous Every 6 hours 12/07/17 1247 12/07/17 2234   12/07/17 1100  ceFAZolin (ANCEF) IVPB 2g/100 mL premix     2 g 200 mL/hr over 30 Minutes  Intravenous  Once 12/07/17 0709 12/07/17 1009      Subjective:   [name] [name] seen and examined today. Continues to complain of mild left hip pain today.  Denies current chest pain, shortness of breath, abdominal pain, nausea or vomiting, diarrhea or constipation, dizziness or headache.  Patient does not feel that she can return home given that she does not have good support and multiple stairs in her home. Objective:   Vitals:   12/08/17 0523 12/08/17 1336 12/08/17 2126 12/09/17 0548  BP: (!) 141/63 (!) 156/73 133/69 (!) 152/77  Pulse: 89 81 87 98  Resp: 16 16 16 15   Temp: 99.3 F (37.4 C) 98.4 F (36.9 C) 99.2 F (37.3 C) 99 F (37.2 C)  TempSrc: Oral Oral Oral   SpO2: 98% 100% 100% 100%  Weight:      Height:        Intake/Output Summary (Last 24 hours) at 12/09/2017 1027 Last data filed at 12/09/2017 1009 Gross per 24 hour  Intake 1440 ml  Output 700 ml  Net 740 ml   Filed Weights   12/06/17 0840  Weight: 44.5 kg   Exam  General: Well developed, well nourished, thin, NAD, appears stated age  41: NCAT, mucous membranes moist.   Cardiovascular: S1 S2 auscultated, no murmurs, RRR  Respiratory: Clear to auscultation bilaterally with equal chest rise  Abdomen: Soft, nontender, nondistended, + bowel sounds  Extremities: warm dry without cyanosis clubbing or edema  Neuro: AAOx3, nonfocal  Psych: Normal affect and demeanor with intact judgement and insight, pleasant     Data Reviewed: I have personally reviewed following labs and imaging studies  CBC: Recent Labs  Lab 12/06/17 0950 12/07/17 0635 12/08/17 0543 12/09/17 0336  WBC 9.0 6.5 5.6 5.9  NEUTROABS 7.7  --  3.7  --   HGB 13.4 11.7* 11.4* 11.4*  HCT 43.0 38.9 37.7 37.5  MCV 97.3 102.1* 103.9* 99.5  PLT 249 202 188 841   Basic Metabolic Panel: Recent Labs  Lab 12/06/17 0950 12/07/17 0635 12/08/17 0543 12/09/17 0336  NA 141 141 137 138  K 4.5 4.2 3.6 4.1  CL 112* 111 104 106  CO2 22 26  24 26   GLUCOSE 123* 104* 95 103*  BUN 17 13 10 9   CREATININE 0.71 0.74 0.73 0.68  CALCIUM 9.5  9.4 8.7* 8.8* 8.6*  MG  --   --  1.9  --   PHOS  --   --  2.2*  --    GFR: Estimated Creatinine Clearance: 42 mL/min (by C-G formula based on SCr of 0.68 mg/dL). Liver Function Tests: Recent Labs  Lab 12/06/17 0950 12/08/17 0543  AST  --  124*  ALT  --  104*  ALKPHOS  --  90  BILITOT  --  1.5*  PROT  --  6.2*  ALBUMIN 4.5 3.4*   No results for input(s): LIPASE, AMYLASE in the last 168 hours. No results for input(s): AMMONIA in the last 168 hours. Coagulation Profile: No results for input(s): INR, PROTIME in the last 168 hours. Cardiac Enzymes: No results for input(s): CKTOTAL, CKMB, CKMBINDEX, TROPONINI in the last 168 hours.  BNP (last 3 results) No results for input(s): PROBNP in the last 8760 hours. HbA1C: No results for input(s): HGBA1C in the last 72 hours. CBG: No results for input(s): GLUCAP in the last 168 hours. Lipid Profile: No results for input(s): CHOL, HDL, LDLCALC, TRIG, CHOLHDL, LDLDIRECT in the last 72 hours. Thyroid Function Tests: No results for input(s): TSH, T4TOTAL, FREET4, T3FREE, THYROIDAB in the last 72 hours. Anemia Panel: Recent Labs    12/08/17 0543  VITAMINB12 590  FOLATE >22.4  FERRITIN 136  TIBC 322  IRON 31  RETICCTPCT 2.1   Urine analysis:    Component Value Date/Time   COLORURINE yellow 09/02/2007 0923   APPEARANCEUR Clear 09/02/2007 0923   LABSPEC >=1.030 09/02/2007 0923   PHURINE 5.0 09/02/2007 0923   HGBUR negative 09/02/2007 0923   BILIRUBINUR negative 09/02/2007 0923   UROBILINOGEN 0.2 09/02/2007 0923   NITRITE negative 09/02/2007 0923   Sepsis Labs: @LABRCNTIP (procalcitonin:4,lacticidven:4)  )No results found for this or any previous visit (from the past 240 hour(s)).    Radiology Studies: Dg Pelvis Portable  Result Date: 12/07/2017 CLINICAL DATA:  Hip replacement. EXAM: PORTABLE PELVIS 1-2 VIEWS COMPARISON:   10/17/2017. FINDINGS: Total left hip replacement. Hardware intact. No acute bony abnormality identified. Degenerative change lumbar spine and right hip. IMPRESSION: Total left hip replacement. Hardware intact. No acute abnormality identified. Electronically Signed   By: [place]   On: 12/07/2017 11:19     Scheduled Meds: . docusate sodium  100 mg Oral BID  . dorzolamide  1 drop Both Eyes BID  . feeding supplement (ENSURE ENLIVE)  237 mL Oral BID BM  . latanoprost  1 drop Both Eyes QHS  . rivaroxaban  10 mg Oral Q breakfast  . spironolactone  25 mg Oral QPM   Continuous Infusions: . methocarbamol (ROBAXIN) IV       LOS: 3 days   Time Spent in minutes   30 minutes  Natasha Paulson D.O. on 12/09/2017 at 10:27 AM  Between 7am to 7pm - Please see pager noted on [email or web address]  After 7pm go to [email or web address]  And look for the night coverage person covering for me after hours  [facility] Office  [phone]

## 2017-12-09 NOTE — Plan of Care (Signed)
Pt stable with no needs. No changes to note. No s/s of distress. Pt medicated for pain prior to physical therapy.

## 2017-12-09 NOTE — Progress Notes (Signed)
Subjective: 2 Days Post-Op Procedure(s) (LRB): OPEN REDUCTION INTERNAL FIXATION (ORIF) DISTAL FEMUR FRACTURE (Left) Patient reports pain as mild.   Patient seen in rounds for Dr. Wynelle Link. Patient is well, but has had some minor complaints of left knee swelling. She denies pain in the knee but was concerned by the swelling. She denies SOB and chest pain. Reports need for SNF as she only has her husband at home who has cognitive impairment.    Objective: Vital signs in last 24 hours: Temp:  [98.4 F (36.9 C)-99.2 F (37.3 C)] 99 F (37.2 C) (12/01 0548) Pulse Rate:  [81-98] 98 (12/01 0548) Resp:  [15-16] 15 (12/01 0548) BP: (133-156)/(69-77) 152/77 (12/01 0548) SpO2:  [100 %] 100 % (12/01 0548)  Intake/Output from previous day:  Intake/Output Summary (Last 24 hours) at 12/09/2017 0821 Last data filed at 12/09/2017 0530 Gross per 24 hour  Intake 1560 ml  Output 650 ml  Net 910 ml     Labs: Recent Labs    12/06/17 0950 12/07/17 0635 12/08/17 0543 12/09/17 0336  HGB 13.4 11.7* 11.4* 11.4*   Recent Labs    12/08/17 0543 12/09/17 0336  WBC 5.6 5.9  RBC 3.63*  3.63* 3.77*  HCT 37.7 37.5  PLT 188 200   Recent Labs    12/08/17 0543 12/09/17 0336  NA 137 138  K 3.6 4.1  CL 104 106  CO2 24 26  BUN 10 9  CREATININE 0.73 0.68  GLUCOSE 95 103*  CALCIUM 8.8* 8.6*    EXAM General - Patient is Alert and Oriented Extremity - Neurologically intact Intact pulses distally Dorsiflexion/Plantar flexion intact No cellulitis present Compartment soft  Moderate soft tissue swelling in the left knee. Able to flex and extend without difficulty.  Dressing/Incision - clean, dry, no drainage Motor Function - intact, moving foot and toes well on exam.   Past Medical History:  Diagnosis Date  . Allergy    hay fever  . Arthritis    osteoarthritis  . Cataract   . Difficult intubation    CHOLECYSTECTOMY 2003, NO SURGERY SINCE, "LITTLE DIFFICULT TO INTUBATE " PER PT  .  Fibrocystic breast disease    resolved off caffeine & nicotine  . GERD (gastroesophageal reflux disease)    OCC  . Glaucoma    Dr Hillis Range  . Glaucoma    severe both eyes  . Heart murmur    2013 OCCASIONAL PAC's  . Osteopenia   . Other and unspecified hyperlipidemia   . 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 Vitamin D deficiency HX OF    Assessment/Plan: 2 Days Post-Op Procedure(s) (LRB): OPEN REDUCTION INTERNAL FIXATION (ORIF) DISTAL FEMUR FRACTURE (Left) Active Problems:   Vitamin D deficiency   Hyperlipidemia   IBS   OA (osteoarthritis) of hip   S/p left hip fracture   Sinus tachycardia  Estimated body mass index is 19.14 kg/m as calculated from the following:   Height as of this encounter: 5' (1.524 m).   Weight as of this encounter: 44.5 kg. Advance diet Up with therapy Discharge to SNF when bed available and when medically stable  DVT Prophylaxis - Xarelto Weight-Bearing as tolerated   Continue PT. Plan for DC to SNF when medically stable and when bed is available. Ortho DC instructions given. WBAT. Xarelto 10mg  daily for DVT prophylaxis. Change dressing daily. Follow up on 12/12 to see Dr. Wynelle Link.  Ardeen Jourdain, PA-C Orthopaedic Surgery 12/09/2017, 8:21 AM

## 2017-12-10 ENCOUNTER — Encounter (HOSPITAL_COMMUNITY): Payer: Self-pay | Admitting: Orthopedic Surgery

## 2017-12-10 LAB — CBC
HCT: 36.4 % (ref 36.0–46.0)
Hemoglobin: 11.3 g/dL — ABNORMAL LOW (ref 12.0–15.0)
MCH: 30.9 pg (ref 26.0–34.0)
MCHC: 31 g/dL (ref 30.0–36.0)
MCV: 99.5 fL (ref 80.0–100.0)
Platelets: 230 10*3/uL (ref 150–400)
RBC: 3.66 MIL/uL — ABNORMAL LOW (ref 3.87–5.11)
RDW: 13.1 % (ref 11.5–15.5)
WBC: 5.8 10*3/uL (ref 4.0–10.5)
nRBC: 0 % (ref 0.0–0.2)

## 2017-12-10 MED ORDER — POLYETHYLENE GLYCOL 3350 17 G PO PACK: 17.0000 g | PACK | Freq: Every day | ORAL | 0 refills | Status: DC | PRN

## 2017-12-10 MED ORDER — ENSURE ENLIVE PO LIQD: 237.0000 mL | Freq: Two times a day (BID) | ORAL | Status: DC

## 2017-12-10 MED ORDER — DOCUSATE SODIUM 100 MG PO CAPS: 100.0000 mg | ORAL_CAPSULE | Freq: Two times a day (BID) | ORAL | 0 refills | Status: DC

## 2017-12-10 NOTE — Progress Notes (Signed)
Patient undecided about going to Home vs. SNF. Patient concern she will not have support at home. CSW discussed rehab options with the patient. Patient was agreeable to [facility], unfortunately they do not have a bed available. CSW explain other options. Patient agreeable to [facility].  Patient later inform Nurse she prefers to go home.  CSW signed off.   Kathrin Greathouse, Marlinda Mike, MSW Clinical Social Worker  [phone] 12/10/2017  1:57 PM

## 2017-12-10 NOTE — Progress Notes (Signed)
Physical Therapy  Pt declining SNF and now plans to go home per nursing staff.  Upon entering room pt was self amb to and from bathroom with her walker without assist by NT in room.  Introduced my self and plan to practice stairs.  Pt stated, "I am not able to do stairs"(now/today).  Pt stated she is going home by ambulance to get her into her home and then plans to stay downstairs.  MD had ordered [facility] PT and a hospitalbed.  Rica Koyanagi  PTA Acute  Rehabilitation Services Pager      [phone] Office      [phone]

## 2017-12-10 NOTE — Care Management Note (Signed)
Case Management Note  Patient Details  Name: [name] [name] MRN: [id number] Date of Birth: Jan 23, 1940  Subjective/Objective:   Called by RN stating that the patient wants to go home now because [facility] does not have a bed. Provided her with the [facility] list from CMS website. She has chosen [facility] for HHPT. Has DME but requesting a hospital bed. Attending contacted for order. Also requesting transportation home via [facility].           Action/Plan: Contacted [facility] for referral, they have accepted. Contacted [facility] to arrange bed.   Expected Discharge Date:  12/10/17               Expected Discharge Plan:  [place]  In-House Referral:  Clinical Social Work  Discharge planning Services  CM Consult  Post Acute Care Choice:  Home Health Choice offered to:  Patient  DME Arranged:  Hospital bed DME Agency:  [place]:  PT [place]:  [facility]  Status of Service:  Completed, signed off  If discussed at [employer] of Stay Meetings, dates discussed:    Additional Comments:  Guadalupe Maple, RN 12/10/2017, 12:42 PM

## 2017-12-10 NOTE — Discharge Summary (Addendum)
Physician Discharge Summary  [name] [name] [id number] DOB: July 27, 1940 DOA: 12/06/2017  PCP: Velna Hatchet, MD  Admit date: 12/06/2017 Discharge date: 12/10/2017  Time spent: 45 minutes  Recommendations for Outpatient Follow-up:  Patient will be discharged to skilled nursing facility, continue physical and occupational therapy.  Patient will need to follow up with primary care provider within one week of discharge.  Follow up with orthopedics, Dr. Wynelle Link. Patient should continue medications as prescribed.  Patient should follow a regular diet.  Addendum: Patient now wanting to go home. Will order hospital bed and home health.   Discharge Diagnoses:  Left proximal femur fracture Macrocytic anemia/acute blood loss anemia due to surgery Elevated BP Sinus tachycardia  Hyperlipidemia Osteoarthritis left hip Dry eyes/glaucoma IBS Osteoarthritis  Discharge Condition: Stable  Diet recommendation: regular  Filed Weights   12/06/17 0840  Weight: 44.5 kg    History of present illness:  on 12/06/2017 by Dr. 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 She landed on her left hip without hitting her head or losing consciousness. Her husband helped her up and got her upstairs. The pain in her leg started later during the night whenever she moved. She tried to get up this morning but couldn't bear weight to go to the bathroom. She called her orthopedics doctor on call who suggested she come to [facility] ED She had been in her usual state of health prior to that. Denies any no fevers, chills, no chest pain. No dyspnea. No lightheadedness or dizziness.  She has been doing well since her recent  left hip replacement. She has returned to the gym doing resistance work (about 20 lbs with her personal trainer since the procedure) and light training with no complications.  She was on her Xarelto for about 2 weeks after discharge for DVT prophylaxis. She followed up by baby aspirin for the remaining week. She reports intolerance to higher dose of aspirin with severe abdominal pain. She has been off both agents since 11/21 History of Osteoporosis. She had been on fosamax for about 5 years. Evista was added in 2010-2011. She hasn't been on anything other than vitamin D and calcium. Her mother and sister both have osteopenia. No history of chronic prednisone therapy.   Hospital Course:  Left proximal femur fracture -Related to mechanical fall.  Patient recently had left hip total arthroplasty due to osteoarthritis in October 2019. -Orthopedics consulted and appreciated, status post ORIF -Continue pain control and bowel regimen -PT consulted, recommending SNF (Had long discussion with patient, she does agree that rehab is probably better for her than home health given that her husband has cognitive impairment and she does have 12 steps up to her bedroom.  She also has no help at home or family in the area.) -Currently on Xarelto for DVT prophylaxis -Social work consulted   Macrocytic anemia/acute blood loss anemia due to surgery -Hemoglobin stable, currently 11.3 -continue to monitor CBC -Anemia panel shows adequate iron in storage  Elevated BP -Suspect secondary to pain -Patient has no history or diagnosis of hypertension -Continue medications as needed, spironolactone nightly  Sinus tachycardia  -Likely related to pain and dehydration, appears to be stable and resolved  Hyperlipidemia -Has been off of statin since June due  to improvement of LDL -Follow-up with PCP on discharge  Osteoarthritis left hip -As above, status post total hip arthroplasty 10/17/2017 by Dr.  Ronie Spies eyes/glaucoma -Continue eyedrops  IBS -Stable  Osteoarthritis -History of Fosamax and tamoxifen in the past -Follow-up with PCP  Consultants Orthopedic surgery, Dr. Wynelle Link  Procedures  Open reduction internal fixation of left periprosthetic femur fracturedone by Dr. Wynelle Link on 12/07/17  Discharge Exam: Vitals:   12/09/17 2048 12/10/17 0617  BP: (!) 144/70 126/68  Pulse: 89 81  Resp: 16 16  Temp: 98.6 F (37 C) 98.2 F (36.8 C)  SpO2: 100% 100%     General: Well developed, well nourished, thin, NAD  HEENT: NCAT, mucous membranes moist.  Neck: Supple  Cardiovascular: S1 S2 auscultated, no murmurs, RRR  Respiratory: Clear to auscultation   Abdomen: Soft, nontender, nondistended, + bowel sounds  Extremities: warm dry without cyanosis clubbing or edema  Neuro: AAOx3, nonfocal  Psych: Normal affect and demeanor, pleasant   Discharge Instructions Discharge Instructions    Call MD / Call 911   Complete by:  As directed    If you experience chest pain or shortness of breath, CALL 911 and be transported to the hospital emergency room.  If you develope a fever above 101 F, pus (white drainage) or increased drainage or redness at the wound, or calf pain, call your surgeon's office.   Constipation Prevention   Complete by:  As directed    Drink plenty of fluids.  Prune juice may be helpful.  You may use a stool softener, such as Colace (over the counter) 100 mg twice a day.  Use MiraLax (over the counter) for constipation as needed.   Diet - low sodium heart healthy   Complete by:  As directed    Discharge instructions   Complete by:  As directed    Patient will be discharged to skilled nursing facility, continue physical and occupational therapy.  Patient will need to follow up with primary care provider within one week of discharge.  Follow up with orthopedics, Dr. Wynelle Link. Patient should continue medications as prescribed.  Patient should follow a  regular diet.   Discharge wound care:   Complete by:  As directed    Change the dressing daily with a 4x4 gauze and paper tape.   Do not sit on low chairs, stoools or toilet seats, as it may be difficult to get up from low surfaces   Complete by:  As directed    Driving restrictions   Complete by:  As directed    No driving for 2 weeks   Weight bearing as tolerated   Complete by:  As directed      Allergies as of 12/10/2017      Reactions   Bee Venom Anaphylaxis   Highly allergic to Bee Stings    Aspirin Other (See Comments)   STOMACH UPSET   Latex Dermatitis   Powder in latex   Other    NITROUS OXIDE =NAUSEA      Medication List    STOP taking these medications   CALCIUM + D PO   EYE VITAMINS PO   ONE-A-DAY WOMENS PO   OVER THE COUNTER MEDICATION     TAKE these medications   docusate sodium 100 MG capsule Commonly known as:  COLACE Take 1 capsule (100 mg total) by mouth 2 (two) times daily.   dorzolamide 2 % ophthalmic solution Commonly known as:  TRUSOPT Place 1 drop into both eyes  2 (two) times daily.   EPINEPHrine 0.3 mg/0.3 mL Soaj injection Commonly known as:  EPI-PEN Inject 0.3 mLs (0.3 mg total) into the muscle once.   feeding supplement (ENSURE ENLIVE) Liqd Take 237 mLs by mouth 2 (two) times daily between meals.   HYDROcodone-acetaminophen 5-325 MG tablet Commonly known as:  NORCO/VICODIN Take 1-2 tablets by mouth every 6 (six) hours as needed for moderate pain. What changed:  reasons to take this   methocarbamol 500 MG tablet Commonly known as:  ROBAXIN Take 1 tablet (500 mg total) by mouth every 6 (six) hours as needed for muscle spasms.   polyethylene glycol packet Commonly known as:  MIRALAX / GLYCOLAX Take 17 g by mouth daily as needed for mild constipation.   rivaroxaban 10 MG Tabs tablet Commonly known as:  XARELTO Take 1 tablet (10 mg total) by mouth daily with breakfast for 21 days.   simvastatin 20 MG tablet Commonly known as:   ZOCOR Take 1 tablet by mouth at  bedtime   spironolactone 25 MG tablet Commonly known as:  ALDACTONE Take 1 tablet by mouth  daily What changed:  when to take this   SYSTANE OP Apply 1 drop to eye daily as needed (dry eye).   traMADol 50 MG tablet Commonly known as:  ULTRAM Take 1-2 tablets (50-100 mg total) by mouth every 6 (six) hours as needed for moderate pain (give first). What changed:  reasons to take this   travoprost (benzalkonium) 0.004 % ophthalmic solution Commonly known as:  TRAVATAN Place 1 drop into both eyes at bedtime.            Discharge Care Instructions  (From admission, onward)         Start     Ordered   12/07/17 0000  Weight bearing as tolerated     12/07/17 1059   12/07/17 0000  Discharge wound care:    Comments:  Change the dressing daily with a 4x4 gauze and paper tape.   12/07/17 1059         Allergies  Allergen Reactions  . Bee Venom Anaphylaxis    Highly allergic to [employer]   . Aspirin Other (See Comments)    STOMACH UPSET  . Latex Dermatitis    Powder in latex  . Other     NITROUS OXIDE =NAUSEA   Follow-up Information    [name], MD. Schedule an appointment as soon as possible for a visit on 12/20/2017.   Specialty:  Orthopedic Surgery Contact information: [address] [place] [place] [address] [phone]            The results of significant diagnostics from this hospitalization (including imaging, microbiology, ancillary and laboratory) are listed below for reference.    Significant Diagnostic Studies: Dg Pelvis Portable  Result Date: 12/07/2017 CLINICAL DATA:  Hip replacement. EXAM: PORTABLE PELVIS 1-2 VIEWS COMPARISON:  10/17/2017. FINDINGS: Total left hip replacement. Hardware intact. No acute bony abnormality identified. Degenerative change lumbar spine and right hip. IMPRESSION: Total left hip replacement. Hardware intact. No acute abnormality identified. Electronically Signed   By:  Cassandra   On: 12/07/2017 11:19   Mm Screening Breast Tomo Bilateral  Result Date: 11/14/2017 CLINICAL DATA:  Screening. EXAM: DIGITAL SCREENING BILATERAL MAMMOGRAM WITH TOMO AND CAD COMPARISON:  Previous exam(s). ACR Breast Density Category b: There are scattered areas of fibroglandular density. FINDINGS: There are no findings suspicious for malignancy. Images were processed with CAD. IMPRESSION: No mammographic evidence of  malignancy. A result letter of this screening mammogram will be mailed directly to the patient. RECOMMENDATION: Screening mammogram in one year. (Code:SM-B-01Y) BI-RADS CATEGORY  1: Negative. Electronically Signed   By: Dorise Bullion III M.D   On: 11/14/2017 07:56   Dg Hip Unilat W Or Wo Pelvis 1 View Left  Result Date: 12/06/2017 CLINICAL DATA:  Pain after fall EXAM: DG HIP (WITH OR WITHOUT PELVIS) 1V*L* COMPARISON:  None. FINDINGS: There is a fracture surrounding the femoral component of the left hip replacement hardware. There is mild displacement. The acetabular component remains in place. The right hip and pelvic bones are unremarkable. IMPRESSION: Mildly displaced fracture of the proximal femur adjacent to the femoral component of the hip replacement. Electronically Signed   By: Dorise Bullion III M.D   On: 12/06/2017 09:53   Dg Femur Min 2 Views Left  Result Date: 12/06/2017 CLINICAL DATA:  Pain after fall EXAM: LEFT FEMUR 2 VIEWS COMPARISON:  None. FINDINGS: There is a fracture in the proximal femur surrounding the femoral component of a hip replacement. No other fractures identified. IMPRESSION: Fracture of the proximal femur surrounding the femoral component of a hip replacement. See the pelvis/left hip films for further evaluation. Electronically Signed   By: Dorise Bullion III M.D   On: 12/06/2017 09:50    Microbiology: No results found for this or any previous visit (from the past 240 hour(s)).   Labs: Basic Metabolic Panel: Recent Labs  Lab  12/06/17 0950 12/07/17 0635 12/08/17 0543 12/09/17 0336  NA 141 141 137 138  K 4.5 4.2 3.6 4.1  CL 112* 111 104 106  CO2 22 26 24 26   GLUCOSE 123* 104* 95 103*  BUN 17 13 10 9   CREATININE 0.71 0.74 0.73 0.68  CALCIUM 9.5  9.4 8.7* 8.8* 8.6*  MG  --   --  1.9  --   PHOS  --   --  2.2*  --    Liver Function Tests: Recent Labs  Lab 12/06/17 0950 12/08/17 0543  AST  --  124*  ALT  --  104*  ALKPHOS  --  90  BILITOT  --  1.5*  PROT  --  6.2*  ALBUMIN 4.5 3.4*   No results for input(s): LIPASE, AMYLASE in the last 168 hours. No results for input(s): AMMONIA in the last 168 hours. CBC: Recent Labs  Lab 12/06/17 0950 12/07/17 0635 12/08/17 0543 12/09/17 0336 12/10/17 0430  WBC 9.0 6.5 5.6 5.9 5.8  NEUTROABS 7.7  --  3.7  --   --   HGB 13.4 11.7* 11.4* 11.4* 11.3*  HCT 43.0 38.9 37.7 37.5 36.4  MCV 97.3 102.1* 103.9* 99.5 99.5  PLT 249 202 188 200 230   Cardiac Enzymes: No results for input(s): CKTOTAL, CKMB, CKMBINDEX, TROPONINI in the last 168 hours. BNP: BNP (last 3 results) No results for input(s): BNP in the last 8760 hours.  ProBNP (last 3 results) No results for input(s): PROBNP in the last 8760 hours.  CBG: No results for input(s): GLUCAP in the last 168 hours.     Signed:  Cristal Ford  [facility] 12/10/2017, 11:05 AM

## 2017-12-10 NOTE — Progress Notes (Signed)
Subjective: 3 Days Post-Op Procedure(s) (LRB): OPEN REDUCTION INTERNAL FIXATION (ORIF) DISTAL FEMUR FRACTURE (Left) Patient reports pain as mild.   She feels a lot better today and wants to go home as opposed to rehab. She does not want to go to a rehab facility and does not need to go to a rehab facility.  Objective: Vital signs in last 24 hours: Temp:  [98.2 F (36.8 C)-99.3 F (37.4 C)] 98.2 F (36.8 C) (12/02 0617) Pulse Rate:  [81-108] 81 (12/02 0617) Resp:  [16] 16 (12/02 0617) BP: (126-156)/(68-77) 126/68 (12/02 0617) SpO2:  [100 %] 100 % (12/02 0617)  Intake/Output from previous day: 12/01 0701 - 12/02 0700 In: 1440 [P.O.:1440] Out: 600 [Urine:600] Intake/Output this shift: No intake/output data recorded.  Recent Labs    12/08/17 0543 12/09/17 0336 12/10/17 0430  HGB 11.4* 11.4* 11.3*   Recent Labs    12/09/17 0336 12/10/17 0430  WBC 5.9 5.8  RBC 3.77* 3.66*  HCT 37.5 36.4  PLT 200 230   Recent Labs    12/08/17 0543 12/09/17 0336  NA 137 138  K 3.6 4.1  CL 104 106  CO2 24 26  BUN 10 9  CREATININE 0.73 0.68  GLUCOSE 95 103*  CALCIUM 8.8* 8.6*   No results for input(s): LABPT, INR in the last 72 hours.  Neurologically intact Neurovascular intact Dorsiflexion/Plantar flexion intact Incision: dressing C/D/I No cellulitis present Compartment soft  Assessment/Plan: 3 Days Post-Op Procedure(s) (LRB): OPEN REDUCTION INTERNAL FIXATION (ORIF) DISTAL FEMUR FRACTURE (Left) Up with therapy Plan for discharge tomorrow Discharge home with home health   Main limiting factor was stairs at home. Can rent hospital bed and stay downstairs.I have ordered home health PT. She is adamant about going home and should be fine doing that with these accomadations.    [name] [name] 12/10/2017, 7:14 AM

## 2017-12-10 NOTE — Progress Notes (Signed)
    Durable Medical Equipment  (From admission, onward)         Start     Ordered   12/10/17 1242  For home use only DME Hospital bed  Once    Question Answer Comment  Patient has (list medical condition): hip fracture   The above medical condition requires: Patient requires the ability to reposition frequently   Bed type Semi-electric      12/10/17 1242

## 2017-12-11 DIAGNOSIS — M8580 Other specified disorders of bone density and structure, unspecified site: Secondary | ICD-10-CM | POA: Diagnosis not present

## 2017-12-11 DIAGNOSIS — E785 Hyperlipidemia, unspecified: Secondary | ICD-10-CM | POA: Diagnosis not present

## 2017-12-11 DIAGNOSIS — S7292XD Unspecified fracture of left femur, subsequent encounter for closed fracture with routine healing: Secondary | ICD-10-CM | POA: Diagnosis not present

## 2017-12-11 DIAGNOSIS — J45909 Unspecified asthma, uncomplicated: Secondary | ICD-10-CM | POA: Diagnosis not present

## 2017-12-11 DIAGNOSIS — K219 Gastro-esophageal reflux disease without esophagitis: Secondary | ICD-10-CM | POA: Diagnosis not present

## 2017-12-11 DIAGNOSIS — Z7901 Long term (current) use of anticoagulants: Secondary | ICD-10-CM | POA: Diagnosis not present

## 2017-12-11 DIAGNOSIS — Z87891 Personal history of nicotine dependence: Secondary | ICD-10-CM | POA: Diagnosis not present

## 2017-12-11 DIAGNOSIS — K589 Irritable bowel syndrome without diarrhea: Secondary | ICD-10-CM | POA: Diagnosis not present

## 2017-12-11 DIAGNOSIS — Z9181 History of falling: Secondary | ICD-10-CM | POA: Diagnosis not present

## 2017-12-11 DIAGNOSIS — H409 Unspecified glaucoma: Secondary | ICD-10-CM | POA: Diagnosis not present

## 2017-12-11 DIAGNOSIS — Z8611 Personal history of tuberculosis: Secondary | ICD-10-CM | POA: Diagnosis not present

## 2017-12-11 DIAGNOSIS — M9702XD Periprosthetic fracture around internal prosthetic left hip joint, subsequent encounter: Secondary | ICD-10-CM | POA: Diagnosis not present

## 2017-12-14 DIAGNOSIS — H409 Unspecified glaucoma: Secondary | ICD-10-CM | POA: Diagnosis not present

## 2017-12-14 DIAGNOSIS — M8580 Other specified disorders of bone density and structure, unspecified site: Secondary | ICD-10-CM | POA: Diagnosis not present

## 2017-12-14 DIAGNOSIS — M9702XD Periprosthetic fracture around internal prosthetic left hip joint, subsequent encounter: Secondary | ICD-10-CM | POA: Diagnosis not present

## 2017-12-14 DIAGNOSIS — J45909 Unspecified asthma, uncomplicated: Secondary | ICD-10-CM | POA: Diagnosis not present

## 2017-12-14 DIAGNOSIS — S7292XD Unspecified fracture of left femur, subsequent encounter for closed fracture with routine healing: Secondary | ICD-10-CM | POA: Diagnosis not present

## 2017-12-17 DIAGNOSIS — J45909 Unspecified asthma, uncomplicated: Secondary | ICD-10-CM | POA: Diagnosis not present

## 2017-12-17 DIAGNOSIS — M9702XD Periprosthetic fracture around internal prosthetic left hip joint, subsequent encounter: Secondary | ICD-10-CM | POA: Diagnosis not present

## 2017-12-17 DIAGNOSIS — S7292XD Unspecified fracture of left femur, subsequent encounter for closed fracture with routine healing: Secondary | ICD-10-CM | POA: Diagnosis not present

## 2017-12-17 DIAGNOSIS — M8580 Other specified disorders of bone density and structure, unspecified site: Secondary | ICD-10-CM | POA: Diagnosis not present

## 2017-12-17 DIAGNOSIS — H409 Unspecified glaucoma: Secondary | ICD-10-CM | POA: Diagnosis not present

## 2017-12-18 DIAGNOSIS — S7292XD Unspecified fracture of left femur, subsequent encounter for closed fracture with routine healing: Secondary | ICD-10-CM | POA: Diagnosis not present

## 2017-12-18 DIAGNOSIS — M9702XD Periprosthetic fracture around internal prosthetic left hip joint, subsequent encounter: Secondary | ICD-10-CM | POA: Diagnosis not present

## 2017-12-18 DIAGNOSIS — M8580 Other specified disorders of bone density and structure, unspecified site: Secondary | ICD-10-CM | POA: Diagnosis not present

## 2017-12-18 DIAGNOSIS — H409 Unspecified glaucoma: Secondary | ICD-10-CM | POA: Diagnosis not present

## 2017-12-18 DIAGNOSIS — J45909 Unspecified asthma, uncomplicated: Secondary | ICD-10-CM | POA: Diagnosis not present

## 2017-12-20 DIAGNOSIS — M9702XD Periprosthetic fracture around internal prosthetic left hip joint, subsequent encounter: Secondary | ICD-10-CM | POA: Diagnosis not present

## 2017-12-20 DIAGNOSIS — S72001D Fracture of unspecified part of neck of right femur, subsequent encounter for closed fracture with routine healing: Secondary | ICD-10-CM | POA: Diagnosis not present

## 2017-12-21 DIAGNOSIS — M9702XD Periprosthetic fracture around internal prosthetic left hip joint, subsequent encounter: Secondary | ICD-10-CM | POA: Diagnosis not present

## 2017-12-21 DIAGNOSIS — M8580 Other specified disorders of bone density and structure, unspecified site: Secondary | ICD-10-CM | POA: Diagnosis not present

## 2017-12-21 DIAGNOSIS — J45909 Unspecified asthma, uncomplicated: Secondary | ICD-10-CM | POA: Diagnosis not present

## 2017-12-21 DIAGNOSIS — H409 Unspecified glaucoma: Secondary | ICD-10-CM | POA: Diagnosis not present

## 2017-12-21 DIAGNOSIS — S7292XD Unspecified fracture of left femur, subsequent encounter for closed fracture with routine healing: Secondary | ICD-10-CM | POA: Diagnosis not present

## 2018-01-24 DIAGNOSIS — S72001D Fracture of unspecified part of neck of right femur, subsequent encounter for closed fracture with routine healing: Secondary | ICD-10-CM | POA: Diagnosis not present

## 2018-02-19 DIAGNOSIS — H01004 Unspecified blepharitis left upper eyelid: Secondary | ICD-10-CM | POA: Diagnosis not present

## 2018-02-19 DIAGNOSIS — H401132 Primary open-angle glaucoma, bilateral, moderate stage: Secondary | ICD-10-CM | POA: Diagnosis not present

## 2018-02-19 DIAGNOSIS — H26492 Other secondary cataract, left eye: Secondary | ICD-10-CM | POA: Diagnosis not present

## 2018-02-19 DIAGNOSIS — H01001 Unspecified blepharitis right upper eyelid: Secondary | ICD-10-CM | POA: Diagnosis not present

## 2018-02-19 DIAGNOSIS — H01002 Unspecified blepharitis right lower eyelid: Secondary | ICD-10-CM | POA: Diagnosis not present

## 2018-02-26 DIAGNOSIS — H26491 Other secondary cataract, right eye: Secondary | ICD-10-CM | POA: Diagnosis not present

## 2018-03-07 DIAGNOSIS — M25552 Pain in left hip: Secondary | ICD-10-CM | POA: Diagnosis not present

## 2018-03-25 DIAGNOSIS — R2231 Localized swelling, mass and lump, right upper limb: Secondary | ICD-10-CM | POA: Diagnosis not present

## 2018-07-02 DIAGNOSIS — E7849 Other hyperlipidemia: Secondary | ICD-10-CM | POA: Diagnosis not present

## 2018-07-02 DIAGNOSIS — H401132 Primary open-angle glaucoma, bilateral, moderate stage: Secondary | ICD-10-CM | POA: Diagnosis not present

## 2018-07-02 DIAGNOSIS — M810 Age-related osteoporosis without current pathological fracture: Secondary | ICD-10-CM | POA: Diagnosis not present

## 2018-07-04 DIAGNOSIS — Z96642 Presence of left artificial hip joint: Secondary | ICD-10-CM | POA: Diagnosis not present

## 2018-07-04 DIAGNOSIS — Z471 Aftercare following joint replacement surgery: Secondary | ICD-10-CM | POA: Diagnosis not present

## 2018-07-04 DIAGNOSIS — M9702XD Periprosthetic fracture around internal prosthetic left hip joint, subsequent encounter: Secondary | ICD-10-CM | POA: Diagnosis not present

## 2018-07-04 DIAGNOSIS — R82998 Other abnormal findings in urine: Secondary | ICD-10-CM | POA: Diagnosis not present

## 2018-07-09 DIAGNOSIS — Z1331 Encounter for screening for depression: Secondary | ICD-10-CM | POA: Diagnosis not present

## 2018-07-09 DIAGNOSIS — I491 Atrial premature depolarization: Secondary | ICD-10-CM | POA: Diagnosis not present

## 2018-07-09 DIAGNOSIS — H269 Unspecified cataract: Secondary | ICD-10-CM | POA: Diagnosis not present

## 2018-07-09 DIAGNOSIS — A159 Respiratory tuberculosis unspecified: Secondary | ICD-10-CM | POA: Diagnosis not present

## 2018-07-09 DIAGNOSIS — H409 Unspecified glaucoma: Secondary | ICD-10-CM | POA: Diagnosis not present

## 2018-07-09 DIAGNOSIS — Z0000 Encounter for general adult medical examination without abnormal findings: Secondary | ICD-10-CM | POA: Diagnosis not present

## 2018-07-09 DIAGNOSIS — M1612 Unilateral primary osteoarthritis, left hip: Secondary | ICD-10-CM | POA: Diagnosis not present

## 2018-07-09 DIAGNOSIS — M810 Age-related osteoporosis without current pathological fracture: Secondary | ICD-10-CM | POA: Diagnosis not present

## 2018-07-09 DIAGNOSIS — E785 Hyperlipidemia, unspecified: Secondary | ICD-10-CM | POA: Diagnosis not present

## 2018-07-17 ENCOUNTER — Other Ambulatory Visit: Payer: Self-pay | Admitting: Internal Medicine

## 2018-07-17 DIAGNOSIS — E785 Hyperlipidemia, unspecified: Secondary | ICD-10-CM

## 2018-07-22 ENCOUNTER — Inpatient Hospital Stay (HOSPITAL_COMMUNITY): Admission: RE | Admit: 2018-07-22 | Payer: Medicare Other | Source: Ambulatory Visit

## 2018-07-23 ENCOUNTER — Other Ambulatory Visit: Payer: Medicare Other

## 2018-07-25 ENCOUNTER — Encounter (HOSPITAL_COMMUNITY): Payer: Self-pay

## 2018-07-25 NOTE — Patient Instructions (Addendum)
YOU NEED TO HAVE A COVID 19 TEST ON_Saturday 7/18______ @__11 :30_____,  THIS TEST MUST BE DONE BEFORE SURGERY, COME TO [facility] ENTRANCE.  ONCE YOUR COVID TEST IS COMPLETED, PLEASE BEGIN THE QUARANTINE INSTRUCTIONS AS OUTLINED IN YOUR HANDOUT.                Marvene Staff    Your procedure is scheduled on: Wednesday 07/31/18   Report to [facility]  [facility]tr[facility]  Report to admitting at 2:15 pm      Call this number if you have problems the morning of surgery [phone]     Remember: Do not eat food after Midnight.  BRUSH YOUR TEETH MORNING OF SURGERY AND RINSE YOUR MOUTH OUT, NO CHEWING GUM CANDY OR MINTS.    .  YOU MAY HAVE CLEAR LIQUIDS FROM MIDNIGHT UNTIL 1:15 pm . At 1:15 PM Please finish the prescribed Pre-Surgery  drink. Nothing by mouth after you finish the drink !     CLEAR LIQUID DIET   Foods Allowed                                                                     Foods Excluded  Coffee and tea, regular and decaf                             liquids that you cannot  Plain Jell-O any favor except red or purple                                           see through such as: Fruit ices (not with fruit pulp)                                     milk, soups, orange juice  Iced Popsicles                                    All solid food Carbonated beverages, regular and diet                                    Cranberry, grape and apple juices Sports drinks like Gatorade Lightly seasoned clear broth or consume(fat free) Sugar, honey syrup  Sample Menu Breakfast                                Lunch                                     Supper Cranberry juice                    Beef broth  Chicken broth Jell-O                                     Grape juice                           Apple juice Coffee or tea                        Jell-O                                      Popsicle                                                 Coffee or tea                        Coffee or tea  _____________________________________________________________________       Take these medicines the morning of surgery with A SIP OF WATER: none                                You may not have any metal on your body including hair pins and              piercings              Do not wear jewelry, make-up, lotions, powders or perfumes, deodorant             Do not wear nail polish.  Do not shave  48 hours prior to surgery.       .   Do not bring valuables to the hospital. [place].  Contacts, dentures or bridgework may not be worn into surgery.      Name and phone number of your driver:  Special Instructions: N/A              Please read over the following fact sheets you were given: _____________________________________________________________________             [facility] - Preparing for Surgery  Before surgery, you can play an important role.   Because skin is not sterile, your skin needs to be as free of germs as possible.   You can reduce the number of germs on your skin by washing with CHG (chlorahexidine gluconate) soap before surgery.   CHG is an antiseptic cleaner which kills germs and bonds with the skin to continue killing germs even after washing. Please DO NOT use if you have an allergy to CHG or antibacterial soaps.   If your skin becomes reddened/irritated stop using the CHG and inform your nurse when you arrive at Short Stay. Do not shave (including legs and underarms) for at least 48 hours prior to the first CHG shower.Please follow these instructions carefully:  1.  Shower with CHG Soap the night before surgery and the  morning of Surgery.  2.  If you choose to wash your hair,  wash your hair first as usual with your  normal  shampoo.  3.  After you shampoo, rinse your hair and body thoroughly to remove the  shampoo.                                         4.  Use CHG as you would any other liquid soap.  You can apply chg directly  to the skin and wash                       Gently with a scrungie or clean washcloth.  5.  Apply the CHG Soap to your body ONLY FROM THE NECK DOWN.   Do not use on face/ open                           Wound or open sores. Avoid contact with eyes, ears mouth and genitals (private parts).                       Wash face,  Genitals (private parts) with your normal soap.             6.  Wash thoroughly, paying special attention to the area where your surgery  will be performed.  7.  Thoroughly rinse your body with warm water from the neck down.  8.  DO NOT shower/wash with your normal soap after using and rinsing off  the CHG Soap.                9.  Pat yourself dry with a clean towel.            10.  Wear clean pajamas.            11.  Place clean sheets on your bed the night of your first shower and do not  sleep with pets. Day of Surgery : Do not apply any lotions/deodorants the morning of surgery.  Please wear clean clothes to the hospital/surgery center.   Incentive Spirometer  An incentive spirometer is a tool that can help keep your lungs clear and active. This tool measures how well you are filling your lungs with each breath. Taking long deep breaths may help reverse or decrease the chance of developing breathing (pulmonary) problems (especially infection) following:  A long period of time when you are unable to move or be active. BEFORE THE PROCEDURE   If the spirometer includes an indicator to show your best effort, your nurse or respiratory therapist will set it to a desired goal.  If possible, sit up straight or lean slightly forward. Try not to slouch.  Hold the incentive spirometer in an upright position. INSTRUCTIONS FOR USE  1. Sit on the edge of your bed if possible, or sit up as far as you can in bed or on a chair. 2. Hold the incentive spirometer in an upright  position. 3. Breathe out normally. 4. Place the mouthpiece in your mouth and seal your lips tightly around it. 5. Breathe in slowly and as deeply as possible, raising the piston or the ball toward the top of the column. 6. Hold your breath for 3-5 seconds or for as long as possible. Allow the piston or ball to fall to the bottom of the column. 7. Remove the mouthpiece from your mouth  and breathe out normally. 8. Rest for a few seconds and repeat Steps 1 through 7 at least 10 times every 1-2 hours when you are awake. Take your time and take a few normal breaths between deep breaths. 9. The spirometer may include an indicator to show your best effort. Use the indicator as a goal to work toward during each repetition. 10. After each set of 10 deep breaths, practice coughing to be sure your lungs are clear. If you have an incision (the cut made at the time of surgery), support your incision when coughing by placing a pillow or rolled up towels firmly against it. Once you are able to get out of bed, walk around indoors and cough well. You may stop using the incentive spirometer when instructed by your caregiver.  RISKS AND COMPLICATIONS  Take your time so you do not get dizzy or light-headed.  If you are in pain, you may need to take or ask for pain medication before doing incentive spirometry. It is harder to take a deep breath if you are having pain. AFTER USE  Rest and breathe slowly and easily.  It can be helpful to keep track of a log of your progress. Your caregiver can provide you with a simple table to help with this. If you are using the spirometer at home, follow these instructions: [place] IF:   You are having difficultly using the spirometer.  You have trouble using the spirometer as often as instructed.  Your pain medication is not giving enough relief while using the spirometer.  You develop fever of 100.5 F (38.1 C) or higher. SEEK IMMEDIATE MEDICAL CARE IF:    You cough up bloody sputum that had not been present before.  You develop fever of 102 F (38.9 C) or greater.  You develop worsening pain at or near the incision site. MAKE SURE YOU:   Understand these instructions.  Will watch your condition.  Will get help right away if you are not doing well or get worse. Document Released: 05/08/2006 Document Revised: 03/20/2011 Document Reviewed: 07/09/2006 [employer] Patient Information 2014 [employer], [place].   ________________________________________________________________________  FAILURE TO FOLLOW THESE INSTRUCTIONS MAY RESULT IN THE CANCELLATION OF YOUR SURGERY PATIENT SIGNATURE_________________________________  NURSE SIGNATURE__________________________________  ________________________________________________________________________   Adam Phenix  An incentive spirometer is a tool that can help keep your lungs clear and active. This tool measures how well you are filling your lungs with each breath. Taking long deep breaths may help reverse or decrease the chance of developing breathing (pulmonary) problems (especially infection) following:  A long period of time when you are unable to move or be active. BEFORE THE PROCEDURE   If the spirometer includes an indicator to show your best effort, your nurse or respiratory therapist will set it to a desired goal.  If possible, sit up straight or lean slightly forward. Try not to slouch.  Hold the incentive spirometer in an upright position. INSTRUCTIONS FOR USE  11. Sit on the edge of your bed if possible, or sit up as far as you can in bed or on a chair. 12. Hold the incentive spirometer in an upright position. 13. Breathe out normally. 14. Place the mouthpiece in your mouth and seal your lips tightly around it. 15. Breathe in slowly and as deeply as possible, raising the piston or the ball toward the top of the column. 16. Hold your breath for 3-5 seconds or for as long  as possible. Allow the piston or  ball to fall to the bottom of the column. 17. Remove the mouthpiece from your mouth and breathe out normally. 18. Rest for a few seconds and repeat Steps 1 through 7 at least 10 times every 1-2 hours when you are awake. Take your time and take a few normal breaths between deep breaths. 19. The spirometer may include an indicator to show your best effort. Use the indicator as a goal to work toward during each repetition. 20. After each set of 10 deep breaths, practice coughing to be sure your lungs are clear. If you have an incision (the cut made at the time of surgery), support your incision when coughing by placing a pillow or rolled up towels firmly against it. Once you are able to get out of bed, walk around indoors and cough well. You may stop using the incentive spirometer when instructed by your caregiver.  RISKS AND COMPLICATIONS  Take your time so you do not get dizzy or light-headed.  If you are in pain, you may need to take or ask for pain medication before doing incentive spirometry. It is harder to take a deep breath if you are having pain. AFTER USE  Rest and breathe slowly and easily.  It can be helpful to keep track of a log of your progress. Your caregiver can provide you with a simple table to help with this. If you are using the spirometer at home, follow these instructions: [place] IF:   You are having difficultly using the spirometer.  You have trouble using the spirometer as often as instructed.  Your pain medication is not giving enough relief while using the spirometer.  You develop fever of 100.5 F (38.1 C) or higher. SEEK IMMEDIATE MEDICAL CARE IF:   You cough up bloody sputum that had not been present before.  You develop fever of 102 F (38.9 C) or greater.  You develop worsening pain at or near the incision site. MAKE SURE YOU:   Understand these instructions.  Will watch your condition.  Will get help  right away if you are not doing well or get worse. Document Released: 05/08/2006 Document Revised: 03/20/2011 Document Reviewed: 07/09/2006 [facility] Patient Information 2014 [place], [place].   ________________________________________________________________________

## 2018-07-26 ENCOUNTER — Encounter (HOSPITAL_COMMUNITY): Payer: Self-pay

## 2018-07-27 ENCOUNTER — Other Ambulatory Visit (HOSPITAL_COMMUNITY)
Admission: RE | Admit: 2018-07-27 | Discharge: 2018-07-27 | Disposition: A | Discharge status: Home / Self Care | Payer: Medicare Other | Source: Ambulatory Visit | Attending: Orthopedic Surgery | Admitting: Orthopedic Surgery

## 2018-07-27 DIAGNOSIS — Z1159 Encounter for screening for other viral diseases: Secondary | ICD-10-CM | POA: Insufficient documentation

## 2018-07-27 LAB — SARS CORONAVIRUS 2 (TAT 6-24 HRS): SARS Coronavirus 2: NEGATIVE

## 2018-07-29 ENCOUNTER — Encounter (HOSPITAL_COMMUNITY): Payer: Self-pay

## 2018-07-29 ENCOUNTER — Other Ambulatory Visit: Payer: Self-pay

## 2018-07-29 ENCOUNTER — Other Ambulatory Visit (HOSPITAL_COMMUNITY): Payer: Medicare Other

## 2018-07-29 ENCOUNTER — Encounter (HOSPITAL_COMMUNITY)
Admission: RE | Admit: 2018-07-29 | Discharge: 2018-07-29 | Disposition: A | Discharge status: Home / Self Care | Payer: Medicare Other | Source: Ambulatory Visit | Attending: Orthopedic Surgery | Admitting: Orthopedic Surgery

## 2018-07-29 DIAGNOSIS — H409 Unspecified glaucoma: Secondary | ICD-10-CM | POA: Diagnosis not present

## 2018-07-29 DIAGNOSIS — E559 Vitamin D deficiency, unspecified: Secondary | ICD-10-CM | POA: Diagnosis not present

## 2018-07-29 DIAGNOSIS — Z87891 Personal history of nicotine dependence: Secondary | ICD-10-CM | POA: Diagnosis not present

## 2018-07-29 DIAGNOSIS — Z79899 Other long term (current) drug therapy: Secondary | ICD-10-CM | POA: Diagnosis not present

## 2018-07-29 DIAGNOSIS — T8484XA Pain due to internal orthopedic prosthetic devices, implants and grafts, initial encounter: Secondary | ICD-10-CM | POA: Insufficient documentation

## 2018-07-29 DIAGNOSIS — Y793 Surgical instruments, materials and orthopedic devices (including sutures) associated with adverse incidents: Secondary | ICD-10-CM | POA: Diagnosis not present

## 2018-07-29 DIAGNOSIS — M1990 Unspecified osteoarthritis, unspecified site: Secondary | ICD-10-CM | POA: Diagnosis not present

## 2018-07-29 DIAGNOSIS — Z01812 Encounter for preprocedural laboratory examination: Secondary | ICD-10-CM | POA: Insufficient documentation

## 2018-07-29 DIAGNOSIS — M8580 Other specified disorders of bone density and structure, unspecified site: Secondary | ICD-10-CM | POA: Diagnosis not present

## 2018-07-29 HISTORY — DX: Other complications of anesthesia, initial encounter: T88.59XA

## 2018-07-29 HISTORY — DX: Cardiac arrhythmia, unspecified: I49.9

## 2018-07-29 HISTORY — DX: Other specified personal risk factors, not elsewhere classified: Z91.89

## 2018-07-29 LAB — CBC
HCT: 47.7 % — ABNORMAL HIGH (ref 36.0–46.0)
Hemoglobin: 15.3 g/dL — ABNORMAL HIGH (ref 12.0–15.0)
MCH: 32.7 pg (ref 26.0–34.0)
MCHC: 32.1 g/dL (ref 30.0–36.0)
MCV: 101.9 fL — ABNORMAL HIGH (ref 80.0–100.0)
Platelets: 196 10*3/uL (ref 150–400)
RBC: 4.68 MIL/uL (ref 3.87–5.11)
RDW: 13.1 % (ref 11.5–15.5)
WBC: 6.2 10*3/uL (ref 4.0–10.5)
nRBC: 0 % (ref 0.0–0.2)

## 2018-07-29 MED ORDER — ENSURE PRE-SURGERY PO LIQD
296.0000 mL | Freq: Once | ORAL | Status: DC
  Filled 2018-07-29: qty 296

## 2018-07-30 NOTE — Progress Notes (Signed)
Anesthesia Chart Review   Case: [id number] Date/Time: 07/31/18 1515   Procedure: HARDWARE REMOVAL LEFT HIP (Left ) - 30min   Anesthesia type: Choice   Pre-op diagnosis: painful hardware left hip   [facility] ROOM 10 / [facility]   Surgeon: [name], MD      DISCUSSION:77 y.o. former smoker (20 pack years, quit 01/09/82) with h/o GERD, PACs, difficult intubation in the past, painful hardware left hip (s/p ORIF 12/07/17) scheduled for above procedure 07/31/2018 with Dr. [name].    Pt reports difficulty intubation with cholecystectomy in 2003.  S/p ORIF left femur 17/61/60 with no complications noted.    Anticipate pt can proceed with planned procedure barring acute status change.   VS: BP (!) 155/96 (BP Location: Right Arm)   Pulse 87   Temp 37 C (Oral)   Resp 18   Ht 5' (1.524 m)   Wt 46 kg   SpO2 100%   BMI 19.82 kg/m   PROVIDERS: Velna Hatchet, MD is PCP    LABS: Labs reviewed: Acceptable for surgery. (all labs ordered are listed, but only abnormal results are displayed)  Labs Reviewed  CBC - Abnormal; Notable for the following components:      Result Value   Hemoglobin 15.3 (*)    HCT 47.7 (*)    MCV 101.9 (*)    All other components within normal limits     IMAGES:   EKG: 12/07/2017 Rate 105 bpm Sinus tachycardia Probable left atrial enlargement Left ventricular hypertrophy Anterior Q waves, possibly due to LVH  CV:  Past Medical History:  Diagnosis Date  . Allergy    hay fever  . Arthritis    osteoarthritis  . Cataract   . Complication of anesthesia   . Difficult intubation    CHOLECYSTECTOMY 2003, NO SURGERY SINCE, "LITTLE DIFFICULT TO INTUBATE " PER PT  . Dysrhythmia    occational PACs  . Fibrocystic breast disease    resolved off caffeine & nicotine  . GERD (gastroesophageal reflux disease)    OCC  . Glaucoma    Dr Hillis Range  . Glaucoma    severe both eyes  . H/O difficult intubation    small mouth and front teeth are caps   . Heart murmur    Childhood  . Osteopenia   . Other and unspecified hyperlipidemia   . 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 Vitamin D deficiency HX OF    Past Surgical History:  Procedure Laterality Date  . APPENDECTOMY  1970  . BREAST BIOPSY  1978   X 2   . BREAST EXCISIONAL BIOPSY Bilateral   . CESAREAN SECTION  1971  . CHOLECYSTECTOMY  2004   laparoscopic  . COLONOSCOPY  [phone], 2013   all Negative , Dr.Brodie. Due 2023  . DILATION AND CURETTAGE OF UTERUS  1972   twice  . EYE SURGERY Bilateral 2018  . MOUTH SURGERY  03/2015  . MYRINGOTOMY  1946   bilateral  . NASAL SEPTUM SURGERY  1965  . ORIF FEMUR FRACTURE Left 12/07/2017   Procedure: OPEN REDUCTION INTERNAL FIXATION (ORIF) DISTAL FEMUR FRACTURE;  Surgeon: [name], MD;  Location: [facility];  Service: Orthopedics;  Laterality: Left;  . REFRACTIVE SURGERY Bilateral 12/2010   for Glaucoma, Dr Bing Plume  . TONSILLECTOMY  as child   and adenoids  . TOTAL HIP ARTHROPLASTY Left 10/17/2017   Procedure: LEFT TOTAL HIP ARTHROPLASTY ANTERIOR APPROACH;  Surgeon: [name], MD;  Location: [facility];  Service: Orthopedics;  Laterality: Left;  . TUBAL LIGATION      MEDICATIONS: . Carboxymeth-Glyc-Polysorb PF (REFRESH OPTIVE MEGA-3) 0.5-1-0.5 % SOLN  . DHA-EPA-Vitamin E (OMEGA-3 COMPLEX PO)  . dorzolamide (TRUSOPT) 2 % ophthalmic solution  . EPINEPHrine (EPI-PEN) 0.3 mg/0.3 mL DEVI  . Multiple Minerals-Vitamins (CALCIUM & VIT D3 BONE HEALTH PO)  . spironolactone (ALDACTONE) 25 MG tablet  . travoprost, benzalkonium, (TRAVATAN) 0.004 % ophthalmic solution   No current facility-administered medications for this encounter.     Maia Plan [facility] Pre-Surgical Testing [phone] 07/30/18  9:36 AM

## 2018-07-30 NOTE — Anesthesia Preprocedure Evaluation (Addendum)
Anesthesia Evaluation  Patient identified by MRN, date of birth, ID band Patient awake    Reviewed: Allergy & Precautions, NPO status , Patient's Chart, lab work & pertinent test results  History of Anesthesia Complications (+) DIFFICULT AIRWAY  Airway Mallampati: III  TM Distance: <3 FB Neck ROM: Full    Dental  (+) Dental Advisory Given   Pulmonary former smoker (quit 1984),  07/27/2018 SARS coronavirus neg   breath sounds clear to auscultation       Cardiovascular (-) angina Rhythm:Regular Rate:Normal     Neuro/Psych glaucoma    GI/Hepatic Neg liver ROS, GERD  Controlled,  Endo/Other  negative endocrine ROS  Renal/GU negative Renal ROS     Musculoskeletal  (+) Arthritis ,   Abdominal   Peds  Hematology negative hematology ROS (+)   Anesthesia Other Findings   Reproductive/Obstetrics                           Anesthesia Physical Anesthesia Plan  ASA: II  Anesthesia Plan: General   Post-op Pain Management:    Induction: Intravenous  PONV Risk Score and Plan: 3 and Ondansetron, Dexamethasone and Treatment may vary due to age or medical condition  Airway Management Planned: LMA  Additional Equipment:   Intra-op Plan:   Post-operative Plan:   Informed Consent: I have reviewed the patients History and Physical, chart, labs and discussed the procedure including the risks, benefits and alternatives for the proposed anesthesia with the patient or authorized representative who has indicated his/her understanding and acceptance.     Dental advisory given  Plan Discussed with: CRNA and Surgeon  Anesthesia Plan Comments: (See PAT note 07/29/2018, Konrad Felix, PA-C)       Anesthesia Quick Evaluation

## 2018-07-31 ENCOUNTER — Encounter (HOSPITAL_COMMUNITY)
Admission: RE | Disposition: A | Discharge status: Home / Self Care | Payer: Self-pay | Source: Home / Self Care | Attending: Orthopedic Surgery

## 2018-07-31 ENCOUNTER — Ambulatory Visit (HOSPITAL_COMMUNITY): Payer: Medicare Other | Admitting: Certified Registered Nurse Anesthetist

## 2018-07-31 ENCOUNTER — Ambulatory Visit (HOSPITAL_COMMUNITY): Payer: Medicare Other | Admitting: Physician Assistant

## 2018-07-31 ENCOUNTER — Ambulatory Visit (HOSPITAL_COMMUNITY)
Admission: RE | Admit: 2018-07-31 | Discharge: 2018-07-31 | Disposition: A | Discharge status: Home / Self Care | Payer: Medicare Other | Attending: Orthopedic Surgery | Admitting: Orthopedic Surgery

## 2018-07-31 ENCOUNTER — Encounter (HOSPITAL_COMMUNITY): Payer: Self-pay | Admitting: Certified Registered Nurse Anesthetist

## 2018-07-31 ENCOUNTER — Telehealth (HOSPITAL_COMMUNITY): Payer: Self-pay | Admitting: *Deleted

## 2018-07-31 DIAGNOSIS — E785 Hyperlipidemia, unspecified: Secondary | ICD-10-CM | POA: Diagnosis not present

## 2018-07-31 DIAGNOSIS — H409 Unspecified glaucoma: Secondary | ICD-10-CM | POA: Insufficient documentation

## 2018-07-31 DIAGNOSIS — Z79899 Other long term (current) drug therapy: Secondary | ICD-10-CM | POA: Diagnosis not present

## 2018-07-31 DIAGNOSIS — Z96642 Presence of left artificial hip joint: Secondary | ICD-10-CM | POA: Diagnosis not present

## 2018-07-31 DIAGNOSIS — Z87891 Personal history of nicotine dependence: Secondary | ICD-10-CM | POA: Insufficient documentation

## 2018-07-31 DIAGNOSIS — T8484XA Pain due to internal orthopedic prosthetic devices, implants and grafts, initial encounter: Secondary | ICD-10-CM | POA: Insufficient documentation

## 2018-07-31 DIAGNOSIS — M8580 Other specified disorders of bone density and structure, unspecified site: Secondary | ICD-10-CM | POA: Insufficient documentation

## 2018-07-31 DIAGNOSIS — Y793 Surgical instruments, materials and orthopedic devices (including sutures) associated with adverse incidents: Secondary | ICD-10-CM | POA: Insufficient documentation

## 2018-07-31 DIAGNOSIS — E559 Vitamin D deficiency, unspecified: Secondary | ICD-10-CM | POA: Insufficient documentation

## 2018-07-31 DIAGNOSIS — M9702XD Periprosthetic fracture around internal prosthetic left hip joint, subsequent encounter: Secondary | ICD-10-CM | POA: Diagnosis not present

## 2018-07-31 DIAGNOSIS — M1990 Unspecified osteoarthritis, unspecified site: Secondary | ICD-10-CM | POA: Insufficient documentation

## 2018-07-31 HISTORY — PX: HARDWARE REMOVAL: SHX979

## 2018-07-31 SURGERY — REMOVAL, HARDWARE
Anesthesia: General | Site: Hip | Laterality: Left

## 2018-07-31 MED ORDER — MEPERIDINE HCL 50 MG/ML IJ SOLN: 6.2500 mg | INTRAMUSCULAR | Status: DC | PRN

## 2018-07-31 MED ORDER — METHOCARBAMOL 500 MG PO TABS: 500.0000 mg | ORAL_TABLET | Freq: Four times a day (QID) | ORAL | Status: DC | PRN

## 2018-07-31 MED ORDER — MIDAZOLAM HCL 2 MG/2ML IJ SOLN: 0.5000 mg | Freq: Once | INTRAMUSCULAR | Status: DC | PRN

## 2018-07-31 MED ORDER — HYDROCODONE-ACETAMINOPHEN 5-325 MG PO TABS: 1.0000 | ORAL_TABLET | ORAL | Status: DC | PRN

## 2018-07-31 MED ORDER — FENTANYL CITRATE (PF) 100 MCG/2ML IJ SOLN
INTRAMUSCULAR | Status: DC | PRN
  Administered 2018-07-31 (×2): 50 ug via INTRAVENOUS

## 2018-07-31 MED ORDER — PROMETHAZINE HCL 25 MG/ML IJ SOLN: 6.2500 mg | INTRAMUSCULAR | Status: DC | PRN

## 2018-07-31 MED ORDER — CEFAZOLIN SODIUM-DEXTROSE 2-4 GM/100ML-% IV SOLN
2.0000 g | INTRAVENOUS | Status: AC
  Administered 2018-07-31: 14:00:00 2 g via INTRAVENOUS
  Filled 2018-07-31: qty 100

## 2018-07-31 MED ORDER — LACTATED RINGERS IV SOLN
INTRAVENOUS | Status: DC
  Administered 2018-07-31 (×2): via INTRAVENOUS

## 2018-07-31 MED ORDER — DEXAMETHASONE SODIUM PHOSPHATE 10 MG/ML IJ SOLN
INTRAMUSCULAR | Status: DC | PRN
  Administered 2018-07-31: 5 mg via INTRAVENOUS

## 2018-07-31 MED ORDER — PROPOFOL 10 MG/ML IV BOLUS
INTRAVENOUS | Status: DC | PRN
  Administered 2018-07-31 (×2): 100 mg via INTRAVENOUS

## 2018-07-31 MED ORDER — DEXAMETHASONE SODIUM PHOSPHATE 10 MG/ML IJ SOLN
INTRAMUSCULAR | Status: AC
  Filled 2018-07-31: qty 1

## 2018-07-31 MED ORDER — ONDANSETRON HCL 4 MG/2ML IJ SOLN
INTRAMUSCULAR | Status: DC | PRN
  Administered 2018-07-31: 4 mg via INTRAVENOUS

## 2018-07-31 MED ORDER — BUPIVACAINE-EPINEPHRINE 0.25% -1:200000 IJ SOLN
INTRAMUSCULAR | Status: DC | PRN
  Administered 2018-07-31: 25 mL

## 2018-07-31 MED ORDER — CHLORHEXIDINE GLUCONATE 4 % EX LIQD: 60.0000 mL | Freq: Once | CUTANEOUS | Status: DC

## 2018-07-31 MED ORDER — ONDANSETRON HCL 4 MG/2ML IJ SOLN
INTRAMUSCULAR | Status: AC
  Filled 2018-07-31: qty 2

## 2018-07-31 MED ORDER — PROPOFOL 10 MG/ML IV BOLUS
INTRAVENOUS | Status: AC
  Filled 2018-07-31: qty 20

## 2018-07-31 MED ORDER — LIDOCAINE 2% (20 MG/ML) 5 ML SYRINGE
INTRAMUSCULAR | Status: DC | PRN
  Administered 2018-07-31: 60 mg via INTRAVENOUS

## 2018-07-31 MED ORDER — ACETAMINOPHEN 10 MG/ML IV SOLN
1000.0000 mg | Freq: Four times a day (QID) | INTRAVENOUS | Status: DC
  Administered 2018-07-31: 14:00:00 1000 mg via INTRAVENOUS
  Filled 2018-07-31: qty 100

## 2018-07-31 MED ORDER — HYDROMORPHONE HCL 1 MG/ML IJ SOLN
0.2500 mg | INTRAMUSCULAR | Status: DC | PRN
  Administered 2018-07-31 (×2): 0.25 mg via INTRAVENOUS

## 2018-07-31 MED ORDER — POVIDONE-IODINE 10 % EX SWAB
2.0000 "application " | Freq: Once | CUTANEOUS | Status: AC
  Administered 2018-07-31: 2 via TOPICAL

## 2018-07-31 MED ORDER — SODIUM CHLORIDE 0.9 % IR SOLN
Status: DC | PRN
  Administered 2018-07-31: 1000 mL

## 2018-07-31 MED ORDER — FENTANYL CITRATE (PF) 100 MCG/2ML IJ SOLN
INTRAMUSCULAR | Status: AC
  Filled 2018-07-31: qty 2

## 2018-07-31 MED ORDER — HYDROMORPHONE HCL 1 MG/ML IJ SOLN
INTRAMUSCULAR | Status: AC
  Administered 2018-07-31: 15:00:00 0.25 mg via INTRAVENOUS
  Filled 2018-07-31: qty 1

## 2018-07-31 MED ORDER — BUPIVACAINE-EPINEPHRINE (PF) 0.25% -1:200000 IJ SOLN
INTRAMUSCULAR | Status: AC
  Filled 2018-07-31: qty 30

## 2018-07-31 MED ORDER — LIDOCAINE 2% (20 MG/ML) 5 ML SYRINGE
INTRAMUSCULAR | Status: AC
  Filled 2018-07-31: qty 5

## 2018-07-31 SURGICAL SUPPLY — 51 items
BANDAGE ESMARK 6X9 LF (GAUZE/BANDAGES/DRESSINGS) ×1 IMPLANT
BNDG CMPR 9X6 STRL LF SNTH (GAUZE/BANDAGES/DRESSINGS)
BNDG ELASTIC 6X5.8 VLCR STR LF (GAUZE/BANDAGES/DRESSINGS) ×3 IMPLANT
BNDG ESMARK 6X9 LF (GAUZE/BANDAGES/DRESSINGS)
CLOSURE STERI-STRIP 1/2X4 (GAUZE/BANDAGES/DRESSINGS) ×1
CLOSURE WOUND 1/2 X4 (GAUZE/BANDAGES/DRESSINGS) ×1
CLSR STERI-STRIP ANTIMIC 1/2X4 (GAUZE/BANDAGES/DRESSINGS) ×1 IMPLANT
COVER SURGICAL LIGHT HANDLE (MISCELLANEOUS) ×3 IMPLANT
COVER WAND RF STERILE (DRAPES) IMPLANT
CUFF TOURN SGL QUICK 18X4 (TOURNIQUET CUFF) IMPLANT
CUFF TOURN SGL QUICK 34 (TOURNIQUET CUFF)
CUFF TRNQT CYL 34X4.125X (TOURNIQUET CUFF) IMPLANT
DECANTER SPIKE VIAL GLASS SM (MISCELLANEOUS) ×2 IMPLANT
DRAPE C-ARM 42X120 X-RAY (DRAPES) ×1 IMPLANT
DRAPE C-ARMOR (DRAPES) ×1 IMPLANT
DRAPE INCISE IOBAN 66X45 STRL (DRAPES) ×3 IMPLANT
DRAPE ORTHO SPLIT 77X108 STRL (DRAPES)
DRAPE SHEET LG 3/4 BI-LAMINATE (DRAPES) ×3 IMPLANT
DRAPE SURG ORHT 6 SPLT 77X108 (DRAPES) IMPLANT
DRAPE U-SHAPE 47X51 STRL (DRAPES) ×4 IMPLANT
DRSG ADAPTIC 3X8 NADH LF (GAUZE/BANDAGES/DRESSINGS) ×1 IMPLANT
DRSG MEPILEX BORDER 4X4 (GAUZE/BANDAGES/DRESSINGS) ×4 IMPLANT
DRSG PAD ABDOMINAL 8X10 ST (GAUZE/BANDAGES/DRESSINGS) ×1 IMPLANT
DURAPREP 26ML APPLICATOR (WOUND CARE) ×3 IMPLANT
ELECT REM PT RETURN 15FT ADLT (MISCELLANEOUS) ×3 IMPLANT
GAUZE SPONGE 4X4 12PLY STRL (GAUZE/BANDAGES/DRESSINGS) ×1 IMPLANT
GLOVE BIO SURGEON STRL SZ7.5 (GLOVE) ×3 IMPLANT
GLOVE BIO SURGEON STRL SZ8 (GLOVE) ×6 IMPLANT
GLOVE BIOGEL PI IND STRL 7.0 (GLOVE) ×1 IMPLANT
GLOVE BIOGEL PI IND STRL 8 (GLOVE) ×3 IMPLANT
GLOVE BIOGEL PI INDICATOR 7.0 (GLOVE) ×2
GLOVE BIOGEL PI INDICATOR 8 (GLOVE) ×6
GLOVE SURG SS PI 7.0 STRL IVOR (GLOVE) ×3 IMPLANT
GOWN STRL REUS W/TWL LRG LVL3 (GOWN DISPOSABLE) ×7 IMPLANT
KIT BASIN OR (CUSTOM PROCEDURE TRAY) ×3 IMPLANT
KIT TURNOVER KIT A (KITS) IMPLANT
MANIFOLD NEPTUNE II (INSTRUMENTS) ×3 IMPLANT
NEEDLE HYPO 22GX1.5 SAFETY (NEEDLE) ×2 IMPLANT
NS IRRIG 1000ML POUR BTL (IV SOLUTION) ×3 IMPLANT
PACK TOTAL JOINT (CUSTOM PROCEDURE TRAY) ×3 IMPLANT
PADDING CAST COTTON 6X4 STRL (CAST SUPPLIES) ×2 IMPLANT
PROTECTOR NERVE ULNAR (MISCELLANEOUS) ×3 IMPLANT
STAPLER VISISTAT 35W (STAPLE) IMPLANT
STRIP CLOSURE SKIN 1/2X4 (GAUZE/BANDAGES/DRESSINGS) ×2 IMPLANT
SUT MNCRL AB 4-0 PS2 18 (SUTURE) ×3 IMPLANT
SUT VIC AB 0 CT1 36 (SUTURE) ×6 IMPLANT
SUT VIC AB 2-0 CT1 27 (SUTURE) ×6
SUT VIC AB 2-0 CT1 TAPERPNT 27 (SUTURE) ×2 IMPLANT
TOWEL OR 17X26 10 PK STRL BLUE (TOWEL DISPOSABLE) ×6 IMPLANT
UNDERPAD 30X30 (UNDERPADS AND DIAPERS) ×3 IMPLANT
WATER STERILE IRR 1000ML POUR (IV SOLUTION) ×3 IMPLANT

## 2018-07-31 NOTE — Brief Op Note (Signed)
07/31/2018  2:41 PM  PATIENT:  [name] [name]  78 y.o. female  PRE-OPERATIVE DIAGNOSIS:  painful hardware left hip  POST-OPERATIVE DIAGNOSIS:  painful hardware left hip  PROCEDURE:  Procedure(s) with comments: HARDWARE REMOVAL LEFT HIP (Left) - 68min  SURGEON:  Surgeon(s) and Role:    [name], MD - Primary  PHYSICIAN ASSISTANT:   ASSISTANTS: none   ANESTHESIA:   general  EBL:  50 mL   BLOOD ADMINISTERED:none  DRAINS: none   LOCAL MEDICATIONS USED:  MARCAINE     COUNTS:  YES  TOURNIQUET:  * No tourniquets in log *  DICTATION: .Other Dictation: Dictation Number [id number]  PLAN OF CARE: Discharge to home after PACU  PATIENT DISPOSITION:  PACU - hemodynamically stable.

## 2018-07-31 NOTE — Op Note (Signed)
NAME: [name] [name], [name] [name] MEDICAL RECORD [id number] ACCOUNT [id number] DATE OF BIRTH:1940-04-18 FACILITY: [facility] LOCATION: [facility] PHYSICIAN:Damarys Speir Zella Ball, MD  OPERATIVE REPORT  DATE OF PROCEDURE:  07/31/2018  PREOPERATIVE DIAGNOSIS:  Painful hardware, left hip.  POSTOPERATIVE DIAGNOSIS:  Painful hardware, left hip.  PROCEDURE:  Hardware removal, left hip.  SURGEON:  [name], MD  ASSISTANT:  No assistant  ANESTHESIA:  General.  ESTIMATED BLOOD LOSS:  25 mL  DRAINS:  None.  COMPLICATIONS:  None.  CONDITION:  Stable to recovery.  BRIEF CLINICAL NOTE:  The patient is a 78 year old female who had a left total hip arthroplasty done last year and had a fall on Thanksgiving Day sustaining a periprosthetic femur fracture.  It was treated with open reduction internal fixation with  cables.  She healed uneventfully.  She was having pain in relation to the cables now.  She presents now for hardware removal due to painful hardware.  PROCEDURE IN DETAIL:  After successful administration of general anesthetic, the patient was placed in the right lateral decubitus position with the left side up and held with a hip positioner.  Left lower extremity was isolated from her perineum with  plastic drapes and prepped and draped in the usual sterile fashion.  I used approximately 3 inches at the mid aspect of her previous incision, cutting the skin with a 10 blade through subcutaneous tissue to the fascia lata, which was incised in line with  the skin incision.  I was able to palpate the cables underneath the fascia of the vastus lateralis.  Small incision was made in the fascia of the vastus lateralis and the muscle fibers were spread to get to the cables.  I isolated the cables and then  cut them with a cable cutter.  They were both removed in total and no obvious remaining pieces with the cable were left.  I then thoroughly irrigated with saline solution.  Hemostasis was achieved with  electrocautery.  The fascia lata closed with  interrupted #1 Vicryl, subcutaneous closed with interrupted 2-0 Vicryl and subcuticular running 4-0 Monocryl.  Prior to closure, a total of 25 mL of 0.25% Marcaine were injected into the deep tissue, subcutaneous tissue and subcuticular layer.  Once  closed, the incision was cleaned and dried and Steri-Strips and sterile dressing applied.  She was awakened and transported to recovery in stable condition.  TN/NUANCE  D:07/31/2018 T:07/31/2018 JOB:[id number]

## 2018-07-31 NOTE — Anesthesia Postprocedure Evaluation (Signed)
Anesthesia Post Note  Patient: [name] [name]  Procedure(s) Performed: HARDWARE REMOVAL LEFT HIP (Left Hip)     Patient location during evaluation: PACU Anesthesia Type: General Level of consciousness: awake and alert, oriented and patient cooperative Pain management: pain level controlled Vital Signs Assessment: post-procedure vital signs reviewed and stable Respiratory status: spontaneous breathing, nonlabored ventilation and respiratory function stable Cardiovascular status: blood pressure returned to baseline and stable Postop Assessment: no apparent nausea or vomiting and adequate PO intake Anesthetic complications: no    Last Vitals:  Vitals:   07/31/18 1600 07/31/18 1615  BP: 124/67 131/66  Pulse: 60 77  Resp: 20 16  Temp:  36.4 C  SpO2: 100% 100%    Last Pain:  Vitals:   07/31/18 1615  TempSrc: Temporal  PainSc: 0-No pain                 Mickie Kozikowski,E. Chassity Ludke

## 2018-07-31 NOTE — Anesthesia Procedure Notes (Signed)
Procedure Name: LMA Insertion Performed by: Gean Maidens, CRNA Pre-anesthesia Checklist: Patient identified, Emergency Drugs available, Suction available and Timeout performed Patient Re-evaluated:Patient Re-evaluated prior to induction Oxygen Delivery Method: Circle system utilized Preoxygenation: Pre-oxygenation with 100% oxygen Induction Type: IV induction Ventilation: Mask ventilation without difficulty LMA: LMA inserted LMA Size: 3.0 Number of attempts: 2 Placement Confirmation: positive ETCO2 and breath sounds checked- equal and bilateral Tube secured with: Tape Dental Injury: Teeth and Oropharynx as per pre-operative assessment

## 2018-07-31 NOTE — Discharge Instructions (Signed)
Dr. [name] [facility] [address], [place], [place] [address] [phone]  [place]   Remove items at home which could result in a fall. This includes throw rugs or furniture in walking pathways.   ICE to the affected hip every three hours for 30 minutes at a time and then as needed for pain and swelling.  Continue to use ice on the hip for pain and swelling from surgery. You may notice swelling that will progress down to the foot and ankle.  This is normal after surgery.  Elevate the leg when you are not up walking on it.    Continue to use the breathing machine which will help keep your temperature down.  It is common for your temperature to cycle up and down following surgery, especially at night when you are not up moving around and exerting yourself.  The breathing machine keeps your lungs expanded and your temperature down.  DIET You may resume your previous home diet once your are discharged from the hospital.  DRESSING / WOUND CARE / SHOWERING You may change your dressing 3-5 days after surgery.  Then change the dressing every day with sterile gauze.  Please use good hand washing techniques before changing the dressing.  Do not use any lotions or creams on the incision until instructed by your surgeon. You may start showering once you are discharged home but do not submerge the incision under water. Just pat the incision dry and apply a dry gauze dressing on daily. Change the surgical dressing daily and reapply a dry dressing each time.  WEIGHT BEARING Weight bearing as tolerated with assist device (walker, cane, etc) as directed, use it as long as suggested by your surgeon or therapist, typically at least 4-6 weeks.  POSTOPERATIVE CONSTIPATION PROTOCOL Constipation - defined medically as fewer than three stools per week and severe constipation as less than one stool per week.  One of the  most common issues patients have following surgery is constipation.  Even if you have a regular bowel pattern at home, your normal regimen is likely to be disrupted due to multiple reasons following surgery.  Combination of anesthesia, postoperative narcotics, change in appetite and fluid intake all can affect your bowels.  In order to avoid complications following surgery, here are some recommendations in order to help you during your recovery period.  Colace (docusate) - Pick up an over-the-counter form of Colace or another stool softener and take twice a day as long as you are requiring postoperative pain medications.  Take with a full glass of water daily.  If you experience loose stools or diarrhea, hold the colace until you stool forms back up.  If your symptoms do not get better within 1 week or if they get worse, check with your doctor.  Dulcolax (bisacodyl) - Pick up over-the-counter and take as directed by the product packaging as needed to assist with the movement of your bowels.  Take with a full glass of water.  Use this product as needed if not relieved by Colace only.   MiraLax (polyethylene glycol) - Pick up over-the-counter to have on hand.  MiraLax is a solution that will increase the amount of water in your bowels to assist with bowel movements.  Take as directed and can mix with a glass of water, juice, soda, coffee, or tea.  Take if you go more than two days without a movement. Do not use MiraLax  more than once per day. Call your doctor if you are still constipated or irregular after using this medication for 7 days in a row.  If you continue to have problems with postoperative constipation, please contact the office for further assistance and recommendations.  If you experience "the worst abdominal pain ever" or develop nausea or vomiting, please contact the office immediatly for further recommendations for treatment.  ITCHING  If you experience itching with your medications, try  taking only a single pain pill, or even half a pain pill at a time.  You can also use Benadryl over the counter for itching or also to help with sleep.   MEDICATIONS See your medication summary on the After Visit Summary that the nursing staff will review with you prior to discharge.  You may have some home medications which will be placed on hold until you complete the course of blood thinner medication.  It is important for you to complete the blood thinner medication as prescribed by your surgeon.  Continue your approved medications as instructed at time of discharge.  PRECAUTIONS If you experience chest pain or shortness of breath - call 911 immediately for transfer to the hospital emergency department.  If you develop a fever greater that 101 F, purulent drainage from wound, increased redness or drainage from wound, foul odor from the wound/dressing, or calf pain - CONTACT YOUR SURGEON.                                                   FOLLOW-UP APPOINTMENTS Make sure you keep all of your appointments after your operation with your surgeon and caregivers. You should call the office at the above phone number and make an appointment for approximately two weeks after the date of your surgery or on the date instructed by your surgeon outlined in the "After Visit Summary".  MAKE SURE YOU:   Understand these instructions.   Get help right away if you are not doing well or get worse.    Pick up stool softner and laxative for home use following surgery while on pain medications. Do not submerge incision under water. Please use good hand washing techniques while changing dressing each day. May shower starting three days after surgery. Please use a clean towel to pat the incision dry following showers. Continue to use ice for pain and swelling after surgery. Do not use any lotions or creams on the incision until instructed by your surgeon.

## 2018-07-31 NOTE — Transfer of Care (Signed)
Immediate Anesthesia Transfer of Care Note  Patient: [name] [name]  Procedure(s) Performed: HARDWARE REMOVAL LEFT HIP (Left Hip)  Patient Location: PACU  Anesthesia Type:General  Level of Consciousness: awake, alert  and oriented  Airway & Oxygen Therapy: Patient Spontanous Breathing and Patient connected to face mask oxygen  Post-op Assessment: Report given to RN and Post -op Vital signs reviewed and stable  Post vital signs: Reviewed and stable  Last Vitals:  Vitals Value Taken Time  BP 109/57 07/31/18 1447  Temp    Pulse 87 07/31/18 1449  Resp 15 07/31/18 1449  SpO2 100 % 07/31/18 1449  Vitals shown include unvalidated device data.  Last Pain:  Vitals:   07/31/18 1139  TempSrc: Oral         Complications: No apparent anesthesia complications

## 2018-07-31 NOTE — H&P (Signed)
CC- [name] [name] is a 78 y.o. female who presents with left hip pain  Hip Pain: Patient complains of left hip pain. Onset of the symptoms was several months ago. Inciting event: Had a fall sustaining a left periprosthetic femur fracture on 12/07/17 treated with ORIF wqith two cables. She healed uneventfully and is doing extremely well functionally but is having pain related to the hardware. SHe present today for removal of the two cables.   Past Medical History:  Diagnosis Date  . Allergy    hay fever  . Arthritis    osteoarthritis  . Cataract   . Complication of anesthesia   . Difficult intubation    CHOLECYSTECTOMY 2003, NO SURGERY SINCE, "LITTLE DIFFICULT TO INTUBATE " PER PT  . Dysrhythmia    occational PACs  . Fibrocystic breast disease    resolved off caffeine & nicotine  . GERD (gastroesophageal reflux disease)    OCC  . Glaucoma    Dr Hillis Range  . Glaucoma    severe both eyes  . H/O difficult intubation    small mouth and front teeth are caps  . Heart murmur    Childhood  . Osteopenia   . Other and unspecified hyperlipidemia   . 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 Vitamin D deficiency HX OF    Past Surgical History:  Procedure Laterality Date  . APPENDECTOMY  1970  . BREAST BIOPSY  1978   X 2   . BREAST EXCISIONAL BIOPSY Bilateral   . CESAREAN SECTION  1971  . CHOLECYSTECTOMY  2004   laparoscopic  . COLONOSCOPY  [id number], 2013   all Negative , Dr.Brodie. Due 2023  . DILATION AND CURETTAGE OF UTERUS  1972   twice  . EYE SURGERY Bilateral 2018  . MOUTH SURGERY  03/2015  . MYRINGOTOMY  1946   bilateral  . NASAL SEPTUM SURGERY  1965  . ORIF FEMUR FRACTURE Left 12/07/2017   Procedure: OPEN REDUCTION INTERNAL FIXATION (ORIF) DISTAL FEMUR FRACTURE;  Surgeon: [name], MD;  Location: [facility];  Service: Orthopedics;  Laterality: Left;  . REFRACTIVE SURGERY Bilateral 12/2010   for Glaucoma, Dr Bing Plume  . TONSILLECTOMY  as child   and  adenoids  . TOTAL HIP ARTHROPLASTY Left 10/17/2017   Procedure: LEFT TOTAL HIP ARTHROPLASTY ANTERIOR APPROACH;  Surgeon: [name], MD;  Location: [facility];  Service: Orthopedics;  Laterality: Left;  . TUBAL LIGATION      Prior to Admission medications   Medication Sig Start Date End Date Taking? Authorizing Provider  Carboxymeth-Glyc-Polysorb PF (REFRESH OPTIVE MEGA-3) 0.5-1-0.5 % SOLN Place 1 drop into both eyes 2 (two) times daily as needed (dry eyes).   Yes [provider]  DHA-EPA-Vitamin E (OMEGA-3 COMPLEX PO) Take 1 capsule by mouth daily.   Yes [provider]  dorzolamide (TRUSOPT) 2 % ophthalmic solution Place 1 drop into both eyes 2 (two) times daily.   Yes [provider]  Multiple Minerals-Vitamins (CALCIUM & VIT D3 BONE HEALTH PO) Take 1 tablet by mouth 3 (three) times daily.   Yes [provider]  spironolactone (ALDACTONE) 25 MG tablet Take 1 tablet by mouth  daily Patient taking differently: Take 25 mg by mouth every evening.  07/21/15  Yes Roma Schanz R, DO  travoprost, benzalkonium, (TRAVATAN) 0.004 % ophthalmic solution Place 1 drop into both eyes at bedtime.    Yes [provider]  EPINEPHrine (EPI-PEN) 0.3 mg/0.3 mL DEVI Inject  0.3 mLs (0.3 mg total) into the muscle once. 06/05/12   Hendricks Limes, MD    Physical Examination: General appearance - alert, well appearing, and in no distress Mental status - alert, oriented to person, place, and time Chest - clear to auscultation, no wheezes, rales or rhonchi, symmetric air entry Heart - normal rate, regular rhythm, normal S1, S2, no murmurs, rubs, clicks or gallops Abdomen - soft, nontender, nondistended, no masses or organomegaly Neurological - alert, oriented, normal speech, no focal findings or movement disorder noted  A left hip exam was performed. GENERAL: no acute distress SKIN: intact SWELLING: none WARMTH: no warmth TENDERNESS: Proximal lateral femur over  the hardware  ASSESSMENT: PAinful hardware left femur  Plan Hardware removal left femur. Discussed in detail with patient who elects to proceed  Dione Plover. Denese Mentink, MD    07/31/2018, 1:28 PM

## 2018-08-01 ENCOUNTER — Encounter (HOSPITAL_COMMUNITY): Payer: Self-pay | Admitting: Orthopedic Surgery

## 2018-08-02 ENCOUNTER — Other Ambulatory Visit: Payer: Self-pay | Admitting: Internal Medicine

## 2018-08-02 DIAGNOSIS — Z1231 Encounter for screening mammogram for malignant neoplasm of breast: Secondary | ICD-10-CM

## 2018-11-15 ENCOUNTER — Other Ambulatory Visit: Payer: Self-pay

## 2018-11-15 ENCOUNTER — Ambulatory Visit
Admission: RE | Admit: 2018-11-15 | Discharge: 2018-11-15 | Disposition: A | Discharge status: Home / Self Care | Payer: Medicare Other | Source: Ambulatory Visit | Attending: Internal Medicine | Admitting: Internal Medicine

## 2018-11-15 DIAGNOSIS — Z1231 Encounter for screening mammogram for malignant neoplasm of breast: Secondary | ICD-10-CM | POA: Diagnosis not present

## 2019-01-17 ENCOUNTER — Telehealth: Payer: Self-pay

## 2019-01-17 DIAGNOSIS — M25552 Pain in left hip: Secondary | ICD-10-CM | POA: Diagnosis not present

## 2019-01-17 NOTE — Telephone Encounter (Signed)
Patient called in to schedule COVID19 vaccine - DOB/Address verified  -   Appt sched: 12/09/19 11:30 am  Tried calling patient back to advise all slots have been filled for 02/08/19 - left detailed message regarding no open slots for her to receive vaccine.

## 2019-01-24 ENCOUNTER — Telehealth: Payer: Self-pay

## 2019-01-24 NOTE — Telephone Encounter (Signed)
Pt called to schedule a covid vaccine. Gave her the coivd vaccine hotline number [phone] to call.   [place]

## 2019-02-09 ENCOUNTER — Ambulatory Visit: Payer: Medicare Other

## 2019-02-14 DIAGNOSIS — M25552 Pain in left hip: Secondary | ICD-10-CM | POA: Diagnosis not present

## 2019-02-14 DIAGNOSIS — M76892 Other specified enthesopathies of left lower limb, excluding foot: Secondary | ICD-10-CM | POA: Diagnosis not present

## 2019-02-16 ENCOUNTER — Ambulatory Visit: Payer: Medicare Other | Attending: Internal Medicine

## 2019-02-25 DIAGNOSIS — M76892 Other specified enthesopathies of left lower limb, excluding foot: Secondary | ICD-10-CM | POA: Diagnosis not present

## 2019-02-25 DIAGNOSIS — R269 Unspecified abnormalities of gait and mobility: Secondary | ICD-10-CM | POA: Diagnosis not present

## 2019-02-28 DIAGNOSIS — R269 Unspecified abnormalities of gait and mobility: Secondary | ICD-10-CM | POA: Diagnosis not present

## 2019-02-28 DIAGNOSIS — M76892 Other specified enthesopathies of left lower limb, excluding foot: Secondary | ICD-10-CM | POA: Diagnosis not present

## 2019-03-02 ENCOUNTER — Ambulatory Visit: Payer: Medicare Other

## 2019-03-05 DIAGNOSIS — M76892 Other specified enthesopathies of left lower limb, excluding foot: Secondary | ICD-10-CM | POA: Diagnosis not present

## 2019-03-05 DIAGNOSIS — R269 Unspecified abnormalities of gait and mobility: Secondary | ICD-10-CM | POA: Diagnosis not present

## 2019-03-07 DIAGNOSIS — M76892 Other specified enthesopathies of left lower limb, excluding foot: Secondary | ICD-10-CM | POA: Diagnosis not present

## 2019-03-07 DIAGNOSIS — R269 Unspecified abnormalities of gait and mobility: Secondary | ICD-10-CM | POA: Diagnosis not present

## 2019-03-19 DIAGNOSIS — R269 Unspecified abnormalities of gait and mobility: Secondary | ICD-10-CM | POA: Diagnosis not present

## 2019-03-19 DIAGNOSIS — M76892 Other specified enthesopathies of left lower limb, excluding foot: Secondary | ICD-10-CM | POA: Diagnosis not present

## 2019-03-26 DIAGNOSIS — R269 Unspecified abnormalities of gait and mobility: Secondary | ICD-10-CM | POA: Diagnosis not present

## 2019-03-26 DIAGNOSIS — M76892 Other specified enthesopathies of left lower limb, excluding foot: Secondary | ICD-10-CM | POA: Diagnosis not present

## 2019-03-31 DIAGNOSIS — R269 Unspecified abnormalities of gait and mobility: Secondary | ICD-10-CM | POA: Diagnosis not present

## 2019-03-31 DIAGNOSIS — M76892 Other specified enthesopathies of left lower limb, excluding foot: Secondary | ICD-10-CM | POA: Diagnosis not present

## 2019-04-02 DIAGNOSIS — S0181XA Laceration without foreign body of other part of head, initial encounter: Secondary | ICD-10-CM | POA: Diagnosis not present

## 2019-04-10 IMAGING — CR DG HIP (WITH OR WITHOUT PELVIS) 1V*L*
4 series · 4 of 4 positions shown · non-contrast
Comparison: None.

CLINICAL DATA: Pain after fall

EXAM:
DG HIP (WITH OR WITHOUT PELVIS) 1V*L*

[x pelvis]
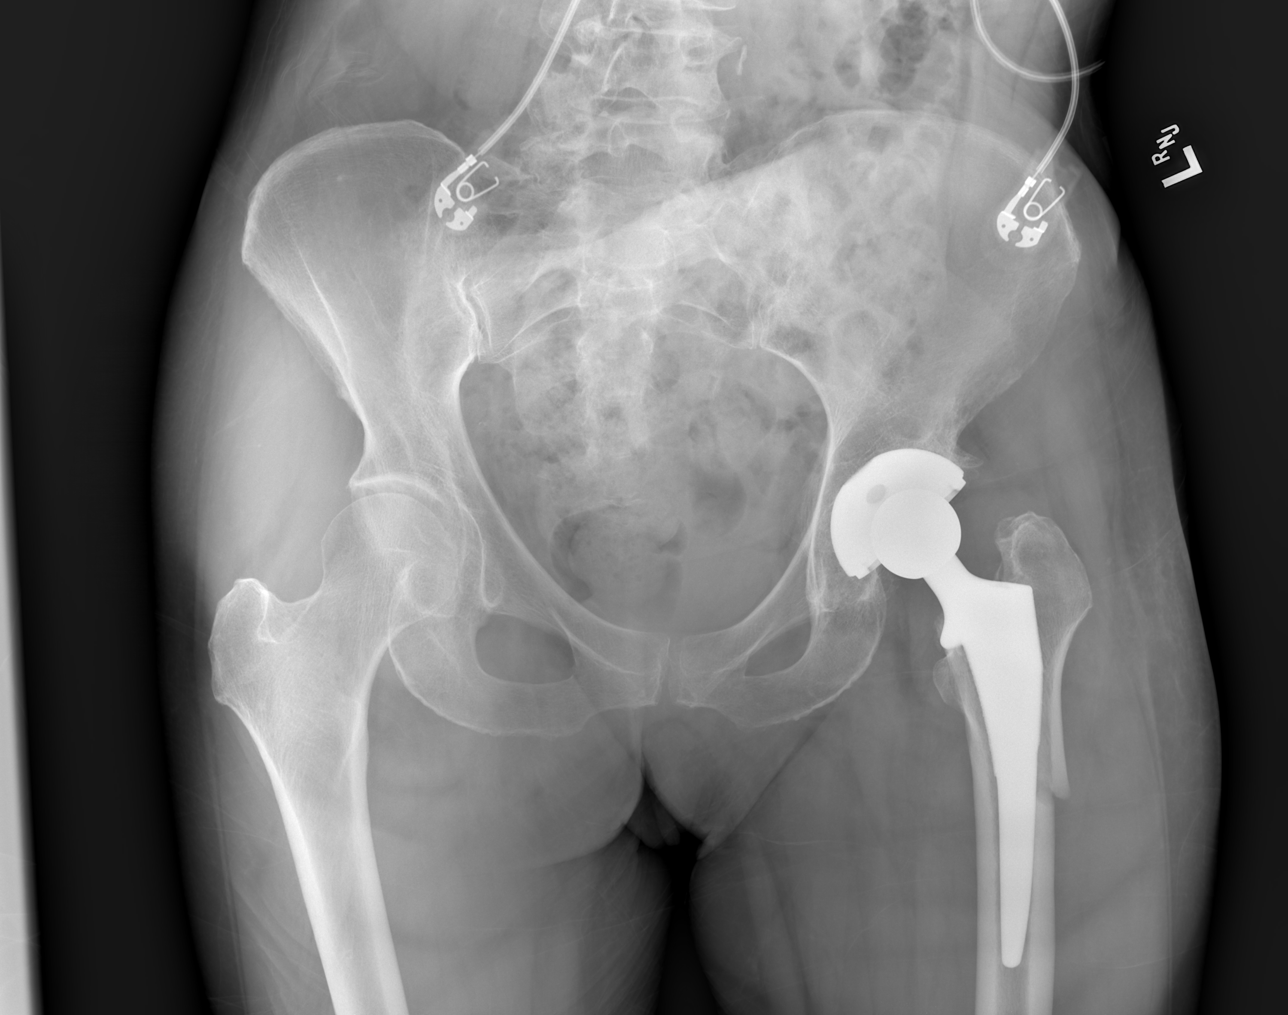

[x hip ap left]
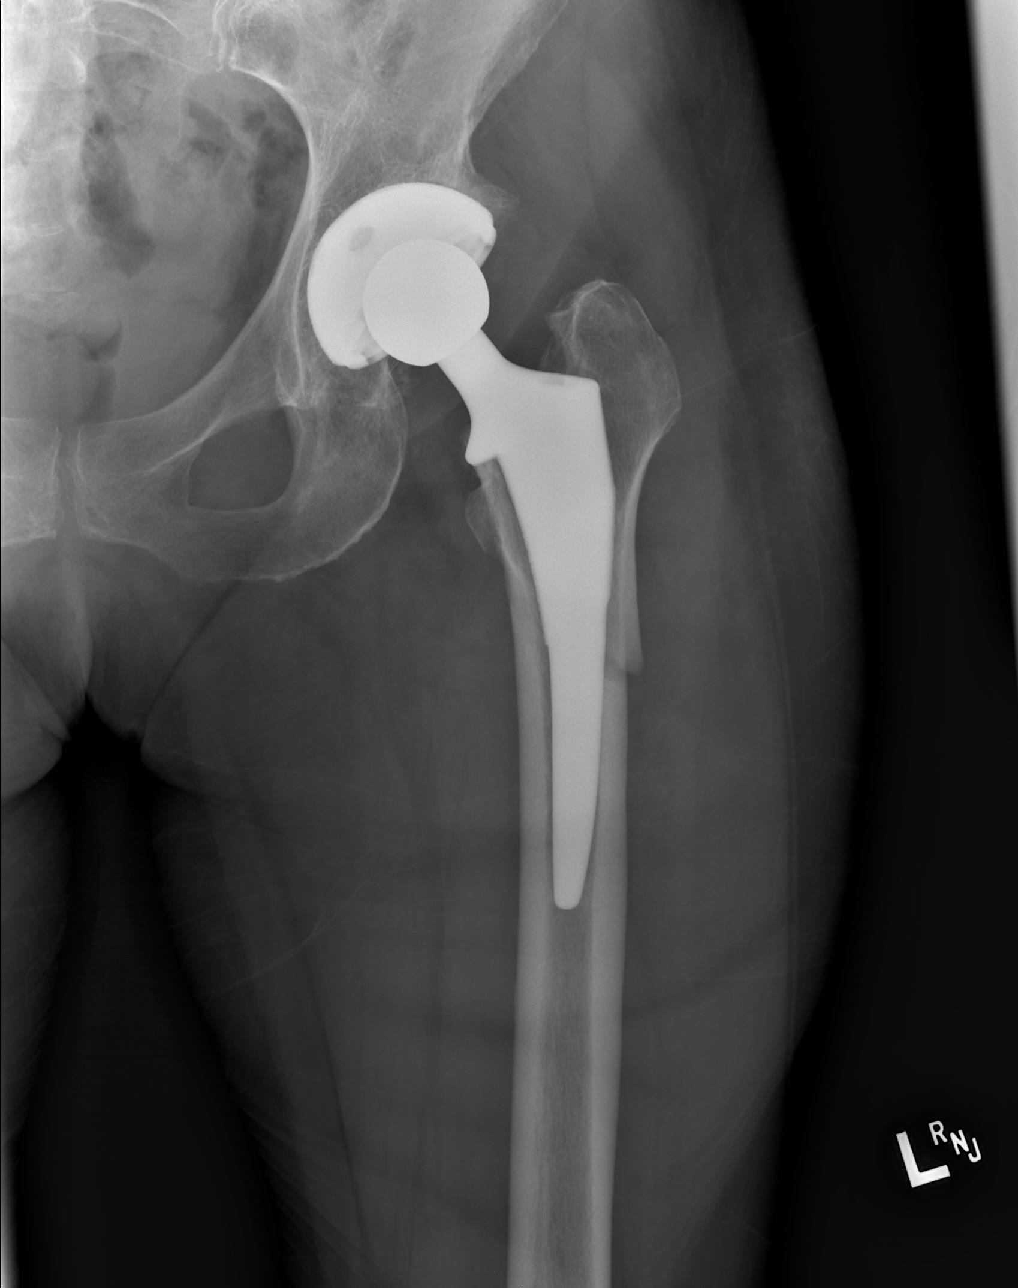

[w hip lat left (1 of 2)]
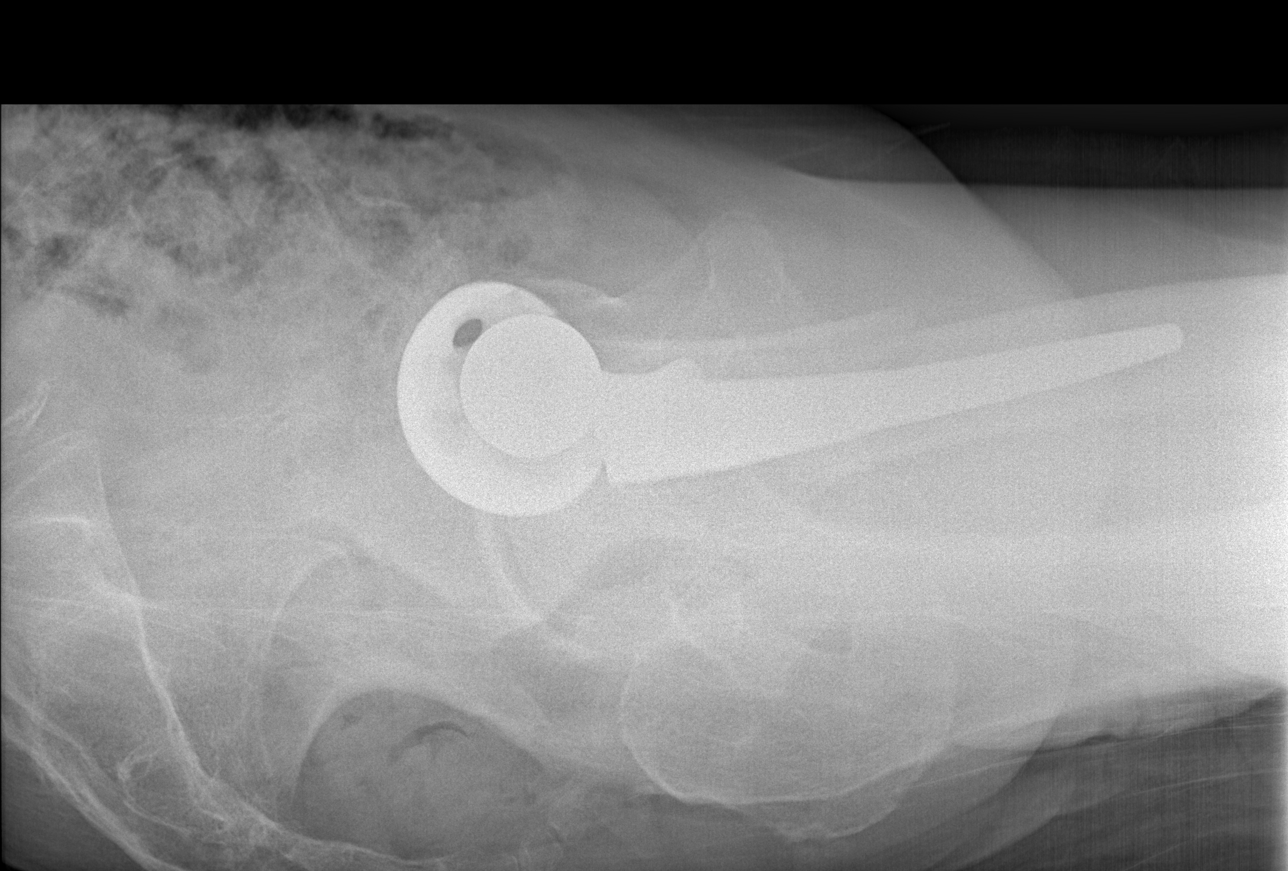

[w hip lat left (2 of 2)]
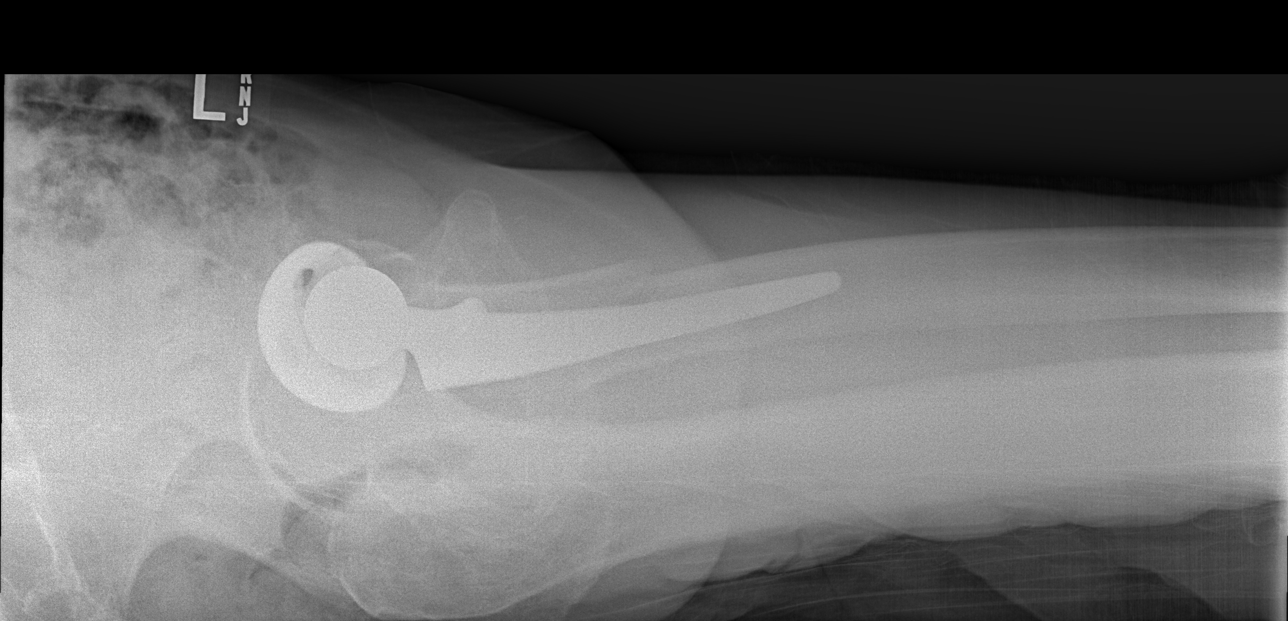

[4 of 4 positions shown; findings below may reference images not displayed]

FINDINGS: There is a fracture surrounding the femoral component of the left
hip replacement hardware. There is mild displacement. The acetabular
component remains in place. The right hip and pelvic bones are
unremarkable.
IMPRESSION: Mildly displaced fracture of the proximal femur adjacent to the
femoral component of the hip replacement.

## 2019-04-18 DIAGNOSIS — M76892 Other specified enthesopathies of left lower limb, excluding foot: Secondary | ICD-10-CM | POA: Diagnosis not present

## 2019-04-18 DIAGNOSIS — R269 Unspecified abnormalities of gait and mobility: Secondary | ICD-10-CM | POA: Diagnosis not present

## 2019-05-15 DIAGNOSIS — H26492 Other secondary cataract, left eye: Secondary | ICD-10-CM | POA: Diagnosis not present

## 2019-05-15 DIAGNOSIS — Z961 Presence of intraocular lens: Secondary | ICD-10-CM | POA: Diagnosis not present

## 2019-05-15 DIAGNOSIS — H04123 Dry eye syndrome of bilateral lacrimal glands: Secondary | ICD-10-CM | POA: Diagnosis not present

## 2019-05-15 DIAGNOSIS — H401132 Primary open-angle glaucoma, bilateral, moderate stage: Secondary | ICD-10-CM | POA: Diagnosis not present

## 2019-06-06 DIAGNOSIS — H401132 Primary open-angle glaucoma, bilateral, moderate stage: Secondary | ICD-10-CM | POA: Diagnosis not present

## 2019-06-06 DIAGNOSIS — H10503 Unspecified blepharoconjunctivitis, bilateral: Secondary | ICD-10-CM | POA: Diagnosis not present

## 2019-06-06 DIAGNOSIS — H26492 Other secondary cataract, left eye: Secondary | ICD-10-CM | POA: Diagnosis not present

## 2019-06-06 DIAGNOSIS — H04123 Dry eye syndrome of bilateral lacrimal glands: Secondary | ICD-10-CM | POA: Diagnosis not present

## 2019-07-25 DIAGNOSIS — R739 Hyperglycemia, unspecified: Secondary | ICD-10-CM | POA: Diagnosis not present

## 2019-07-25 DIAGNOSIS — M810 Age-related osteoporosis without current pathological fracture: Secondary | ICD-10-CM | POA: Diagnosis not present

## 2019-07-25 DIAGNOSIS — K589 Irritable bowel syndrome without diarrhea: Secondary | ICD-10-CM | POA: Diagnosis not present

## 2019-07-25 DIAGNOSIS — R82998 Other abnormal findings in urine: Secondary | ICD-10-CM | POA: Diagnosis not present

## 2019-07-25 DIAGNOSIS — M25552 Pain in left hip: Secondary | ICD-10-CM | POA: Diagnosis not present

## 2019-07-25 DIAGNOSIS — Z0000 Encounter for general adult medical examination without abnormal findings: Secondary | ICD-10-CM | POA: Diagnosis not present

## 2019-08-01 ENCOUNTER — Other Ambulatory Visit: Payer: Self-pay | Admitting: Internal Medicine

## 2019-08-01 DIAGNOSIS — M810 Age-related osteoporosis without current pathological fracture: Secondary | ICD-10-CM

## 2019-08-28 DIAGNOSIS — E785 Hyperlipidemia, unspecified: Secondary | ICD-10-CM | POA: Diagnosis not present

## 2019-10-10 DIAGNOSIS — Z23 Encounter for immunization: Secondary | ICD-10-CM | POA: Diagnosis not present

## 2019-10-24 ENCOUNTER — Other Ambulatory Visit: Payer: Self-pay

## 2019-10-24 ENCOUNTER — Ambulatory Visit
Admission: RE | Admit: 2019-10-24 | Discharge: 2019-10-24 | Disposition: A | Discharge status: Home / Self Care | Payer: Medicare Other | Source: Ambulatory Visit | Attending: Internal Medicine | Admitting: Internal Medicine

## 2019-10-24 DIAGNOSIS — M810 Age-related osteoporosis without current pathological fracture: Secondary | ICD-10-CM

## 2019-10-24 DIAGNOSIS — Z780 Asymptomatic menopausal state: Secondary | ICD-10-CM | POA: Diagnosis not present

## 2019-11-27 DIAGNOSIS — H26492 Other secondary cataract, left eye: Secondary | ICD-10-CM | POA: Diagnosis not present

## 2019-11-27 DIAGNOSIS — H401132 Primary open-angle glaucoma, bilateral, moderate stage: Secondary | ICD-10-CM | POA: Diagnosis not present

## 2019-11-27 DIAGNOSIS — H10503 Unspecified blepharoconjunctivitis, bilateral: Secondary | ICD-10-CM | POA: Diagnosis not present

## 2020-04-02 DIAGNOSIS — H401132 Primary open-angle glaucoma, bilateral, moderate stage: Secondary | ICD-10-CM | POA: Diagnosis not present

## 2020-04-02 DIAGNOSIS — H26492 Other secondary cataract, left eye: Secondary | ICD-10-CM | POA: Diagnosis not present

## 2020-04-02 DIAGNOSIS — H10503 Unspecified blepharoconjunctivitis, bilateral: Secondary | ICD-10-CM | POA: Diagnosis not present

## 2020-08-03 DIAGNOSIS — E785 Hyperlipidemia, unspecified: Secondary | ICD-10-CM | POA: Diagnosis not present

## 2020-08-03 DIAGNOSIS — R739 Hyperglycemia, unspecified: Secondary | ICD-10-CM | POA: Diagnosis not present

## 2020-08-03 DIAGNOSIS — M810 Age-related osteoporosis without current pathological fracture: Secondary | ICD-10-CM | POA: Diagnosis not present

## 2020-08-06 DIAGNOSIS — H401132 Primary open-angle glaucoma, bilateral, moderate stage: Secondary | ICD-10-CM | POA: Diagnosis not present

## 2020-08-06 DIAGNOSIS — H04123 Dry eye syndrome of bilateral lacrimal glands: Secondary | ICD-10-CM | POA: Diagnosis not present

## 2020-08-06 DIAGNOSIS — Z961 Presence of intraocular lens: Secondary | ICD-10-CM | POA: Diagnosis not present

## 2020-08-06 DIAGNOSIS — H10503 Unspecified blepharoconjunctivitis, bilateral: Secondary | ICD-10-CM | POA: Diagnosis not present

## 2020-08-10 DIAGNOSIS — Z1389 Encounter for screening for other disorder: Secondary | ICD-10-CM | POA: Diagnosis not present

## 2020-08-10 DIAGNOSIS — Z0000 Encounter for general adult medical examination without abnormal findings: Secondary | ICD-10-CM | POA: Diagnosis not present

## 2020-08-10 DIAGNOSIS — Z1212 Encounter for screening for malignant neoplasm of rectum: Secondary | ICD-10-CM | POA: Diagnosis not present

## 2020-08-10 DIAGNOSIS — M810 Age-related osteoporosis without current pathological fracture: Secondary | ICD-10-CM | POA: Diagnosis not present

## 2020-08-10 DIAGNOSIS — Z1331 Encounter for screening for depression: Secondary | ICD-10-CM | POA: Diagnosis not present

## 2020-08-10 DIAGNOSIS — E785 Hyperlipidemia, unspecified: Secondary | ICD-10-CM | POA: Diagnosis not present

## 2020-12-07 DIAGNOSIS — H26492 Other secondary cataract, left eye: Secondary | ICD-10-CM | POA: Diagnosis not present

## 2020-12-07 DIAGNOSIS — Z961 Presence of intraocular lens: Secondary | ICD-10-CM | POA: Diagnosis not present

## 2020-12-07 DIAGNOSIS — H401132 Primary open-angle glaucoma, bilateral, moderate stage: Secondary | ICD-10-CM | POA: Diagnosis not present

## 2020-12-07 DIAGNOSIS — H04123 Dry eye syndrome of bilateral lacrimal glands: Secondary | ICD-10-CM | POA: Diagnosis not present
